# Patient Record
Sex: Female | Born: 1955 | Race: White | Hispanic: No | Marital: Married | Smoking: Never smoker
Health system: Southern US, Community
[De-identification: ages and names within clinical notes are randomized; demographics above are authoritative.]

## PROBLEM LIST (undated history)

## (undated) DIAGNOSIS — N3010 Interstitial cystitis (chronic) without hematuria: Secondary | ICD-10-CM

## (undated) DIAGNOSIS — F1011 Alcohol abuse, in remission: Secondary | ICD-10-CM

## (undated) DIAGNOSIS — E782 Mixed hyperlipidemia: Secondary | ICD-10-CM

## (undated) DIAGNOSIS — S14101A Unspecified injury at C1 level of cervical spinal cord, initial encounter: Secondary | ICD-10-CM

## (undated) DIAGNOSIS — N200 Calculus of kidney: Secondary | ICD-10-CM

## (undated) DIAGNOSIS — F32A Depression, unspecified: Secondary | ICD-10-CM

## (undated) DIAGNOSIS — F329 Major depressive disorder, single episode, unspecified: Secondary | ICD-10-CM

## (undated) HISTORY — DX: Alcohol abuse, in remission: F10.11

## (undated) HISTORY — DX: Interstitial cystitis (chronic) without hematuria: N30.10

## (undated) HISTORY — DX: Unspecified injury at C1 level of cervical spinal cord, initial encounter: S14.101A

## (undated) HISTORY — DX: Calculus of kidney: N20.0

## (undated) HISTORY — DX: Depression, unspecified: F32.A

## (undated) HISTORY — DX: Mixed hyperlipidemia: E78.2

---
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
## 2016-01-18 ENCOUNTER — Encounter: Payer: Self-pay | Admitting: Internal Medicine

## 2016-09-01 ENCOUNTER — Ambulatory Visit
Admission: RE | Admit: 2016-09-01 | Discharge: 2016-09-01 | Disposition: A | Discharge status: Home / Self Care | Payer: Commercial Managed Care - PPO | Source: Ambulatory Visit | Attending: Family Medicine | Admitting: Family Medicine

## 2016-09-01 ENCOUNTER — Other Ambulatory Visit: Payer: Self-pay | Admitting: Family Medicine

## 2016-09-01 DIAGNOSIS — R0602 Shortness of breath: Secondary | ICD-10-CM

## 2017-05-21 ENCOUNTER — Ambulatory Visit
Admission: RE | Admit: 2017-05-21 | Discharge: 2017-05-21 | Disposition: A | Discharge status: Home / Self Care | Payer: Commercial Managed Care - PPO | Source: Ambulatory Visit | Attending: Family Medicine | Admitting: Family Medicine

## 2017-05-21 ENCOUNTER — Other Ambulatory Visit: Payer: Self-pay | Admitting: Family Medicine

## 2017-05-21 DIAGNOSIS — R609 Edema, unspecified: Secondary | ICD-10-CM

## 2017-05-21 DIAGNOSIS — G8929 Other chronic pain: Secondary | ICD-10-CM

## 2017-07-04 ENCOUNTER — Emergency Department (HOSPITAL_COMMUNITY)
Admission: EM | Admit: 2017-07-04 | Discharge: 2017-07-04 | Disposition: A | Discharge status: Home / Self Care | Payer: Commercial Managed Care - PPO | Attending: Emergency Medicine | Admitting: Emergency Medicine

## 2017-07-04 ENCOUNTER — Other Ambulatory Visit: Payer: Self-pay

## 2017-07-04 ENCOUNTER — Emergency Department (HOSPITAL_COMMUNITY): Payer: Commercial Managed Care - PPO

## 2017-07-04 ENCOUNTER — Encounter (HOSPITAL_COMMUNITY): Payer: Self-pay

## 2017-07-04 DIAGNOSIS — Z79899 Other long term (current) drug therapy: Secondary | ICD-10-CM | POA: Insufficient documentation

## 2017-07-04 DIAGNOSIS — N200 Calculus of kidney: Secondary | ICD-10-CM | POA: Insufficient documentation

## 2017-07-04 DIAGNOSIS — Z91040 Latex allergy status: Secondary | ICD-10-CM | POA: Insufficient documentation

## 2017-07-04 DIAGNOSIS — R1032 Left lower quadrant pain: Secondary | ICD-10-CM | POA: Diagnosis present

## 2017-07-04 LAB — URINALYSIS, ROUTINE W REFLEX MICROSCOPIC
Bilirubin Urine: NEGATIVE
Glucose, UA: NEGATIVE mg/dL
Ketones, ur: 20 mg/dL — AB
Leukocytes, UA: NEGATIVE
Nitrite: POSITIVE — AB
Protein, ur: 30 mg/dL — AB
Specific Gravity, Urine: 1.018 (ref 1.005–1.030)
pH: 8 (ref 5.0–8.0)

## 2017-07-04 LAB — CBC
HCT: 37.1 % (ref 36.0–46.0)
Hemoglobin: 12.7 g/dL (ref 12.0–15.0)
MCH: 29.9 pg (ref 26.0–34.0)
MCHC: 34.2 g/dL (ref 30.0–36.0)
MCV: 87.3 fL (ref 78.0–100.0)
Platelets: 175 10*3/uL (ref 150–400)
RBC: 4.25 MIL/uL (ref 3.87–5.11)
RDW: 12.2 % (ref 11.5–15.5)
WBC: 6.7 10*3/uL (ref 4.0–10.5)

## 2017-07-04 LAB — COMPREHENSIVE METABOLIC PANEL
ALT: 14 U/L (ref 14–54)
AST: 16 U/L (ref 15–41)
Albumin: 4.4 g/dL (ref 3.5–5.0)
Alkaline Phosphatase: 63 U/L (ref 38–126)
Anion gap: 8 (ref 5–15)
BUN: 16 mg/dL (ref 6–20)
CO2: 23 mmol/L (ref 22–32)
Calcium: 9.5 mg/dL (ref 8.9–10.3)
Chloride: 103 mmol/L (ref 101–111)
Creatinine, Ser: 0.9 mg/dL (ref 0.44–1.00)
GFR calc Af Amer: >60 mL/min (ref >60)
GFR calc non Af Amer: >60 mL/min (ref >60)
Glucose, Bld: 119 mg/dL — ABNORMAL HIGH (ref 65–99)
Potassium: 3.7 mmol/L (ref 3.5–5.1)
Sodium: 134 mmol/L — ABNORMAL LOW (ref 135–145)
Total Bilirubin: 1.5 mg/dL — ABNORMAL HIGH (ref 0.3–1.2)
Total Protein: 6.6 g/dL (ref 6.5–8.1)

## 2017-07-04 LAB — LIPASE, BLOOD: Lipase: 47 U/L (ref 11–51)

## 2017-07-04 MED ORDER — ONDANSETRON 4 MG PO TBDP
4.0000 mg | ORAL_TABLET | Freq: Once | ORAL | Status: AC | PRN
  Administered 2017-07-04: 4 mg via ORAL
  Filled 2017-07-04: qty 1

## 2017-07-04 MED ORDER — OXYCODONE-ACETAMINOPHEN 5-325 MG PO TABS: 1.0000 | ORAL_TABLET | ORAL | 0 refills | Status: DC | PRN

## 2017-07-04 MED ORDER — DEXTROSE 5 % IV SOLN
1.0000 g | Freq: Once | INTRAVENOUS | Status: AC
  Administered 2017-07-04: 1 g via INTRAVENOUS
  Filled 2017-07-04: qty 10

## 2017-07-04 MED ORDER — SODIUM CHLORIDE 0.9 % IV BOLUS (SEPSIS)
1000.0000 mL | Freq: Once | INTRAVENOUS | Status: AC
  Administered 2017-07-04: 1000 mL via INTRAVENOUS

## 2017-07-04 MED ORDER — OXYCODONE-ACETAMINOPHEN 5-325 MG PO TABS
2.0000 | ORAL_TABLET | Freq: Once | ORAL | Status: AC
  Administered 2017-07-04: 2 via ORAL
  Filled 2017-07-04: qty 2

## 2017-07-04 MED ORDER — OXYCODONE-ACETAMINOPHEN 5-325 MG PO TABS
1.0000 | ORAL_TABLET | ORAL | Status: DC | PRN
  Administered 2017-07-04: 1 via ORAL
  Filled 2017-07-04: qty 1

## 2017-07-04 MED ORDER — HYDROMORPHONE HCL 1 MG/ML IJ SOLN
0.5000 mg | Freq: Once | INTRAMUSCULAR | Status: AC
  Administered 2017-07-04: 0.5 mg via INTRAVENOUS
  Filled 2017-07-04: qty 1

## 2017-07-04 MED ORDER — CEPHALEXIN 500 MG PO CAPS: 1000.0000 mg | ORAL_CAPSULE | Freq: Two times a day (BID) | ORAL | 0 refills | Status: DC

## 2017-07-04 MED ORDER — ONDANSETRON 4 MG PO TBDP: 4.0000 mg | ORAL_TABLET | ORAL | 0 refills | Status: DC | PRN

## 2017-07-04 MED ORDER — TAMSULOSIN HCL 0.4 MG PO CAPS
0.4000 mg | ORAL_CAPSULE | Freq: Once | ORAL | Status: AC
  Administered 2017-07-04: 0.4 mg via ORAL
  Filled 2017-07-04: qty 1

## 2017-07-04 MED ORDER — SODIUM CHLORIDE 0.9 % IV SOLN
1000.0000 mL | INTRAVENOUS | Status: DC
Start: 2017-07-04 — End: 2017-07-04
  Administered 2017-07-04: 1000 mL via INTRAVENOUS

## 2017-07-04 MED ORDER — ONDANSETRON HCL 4 MG/2ML IJ SOLN
4.0000 mg | Freq: Once | INTRAMUSCULAR | Status: AC
  Administered 2017-07-04: 4 mg via INTRAVENOUS
  Filled 2017-07-04: qty 2

## 2017-07-04 MED ORDER — TAMSULOSIN HCL 0.4 MG PO CAPS: 0.4000 mg | ORAL_CAPSULE | Freq: Every day | ORAL | 0 refills | Status: DC

## 2017-07-04 MED ORDER — KETOROLAC TROMETHAMINE 30 MG/ML IJ SOLN
30.0000 mg | Freq: Once | INTRAMUSCULAR | Status: AC
  Administered 2017-07-04: 30 mg via INTRAVENOUS
  Filled 2017-07-04: qty 1

## 2017-07-04 MED ORDER — IBUPROFEN 800 MG PO TABS: 800.0000 mg | ORAL_TABLET | Freq: Three times a day (TID) | ORAL | 0 refills | Status: DC

## 2017-07-04 NOTE — ED Provider Notes (Signed)
[facility]-EMERGENCY DEPT Provider Note   CSN: [id number] Arrival date & time: 07/04/17  1452     History   Chief Complaint Chief Complaint  Patient presents with  . Flank Pain    L  . Abdominal Pain    LLQ    HPI Sherry Ford is a 62 y.o. female.  HPI Patient has left flank pain.  It became severe this morning.  She had associated nausea and vomiting.  It felt similar to a prior kidney stone.  Patient reports that she had a episode about 2 weeks ago when she got pain and thought that maybe it was a stone but it improved.  Last time she had a kidney stone was about 6 years ago.  She reports today she perceives some chills no fever.  No pain burning urgency with urination. History reviewed. No pertinent past medical history.  There are no active problems to display for this patient.   The histories are not reviewed yet. Please review them in the "History" navigator section and refresh this SmartLink.  OB History    No data available       Home Medications    Prior to Admission medications   Medication Sig Start Date End Date Taking? Authorizing Provider  buPROPion (WELLBUTRIN XL) 300 MG 24 hr tablet Take 300 mg by mouth daily. 05/29/17  Yes [provider]  Calcium Carbonate-Vit D-Min (CALCIUM 1200) 1200-1000 MG-UNIT CHEW Chew 1 tablet by mouth daily.   Yes [provider]  cholecalciferol (VITAMIN D) 1000 units tablet Take 2,000 Units by mouth daily.   Yes [provider]  cyanocobalamin (,VITAMIN B-12,) 1000 MCG/ML injection Inject 1,000 mcg into the muscle every 14 (fourteen) days. 05/21/17  Yes [provider]  FLUoxetine (PROZAC) 20 MG capsule Take 20 mg by mouth daily. 06/19/17  Yes [provider]  ibuprofen (ADVIL,MOTRIN) 200 MG tablet Take 400 mg by mouth every 6 (six) hours as needed for moderate pain.   Yes [provider]  Phenazopyridine HCl (AZO URINARY PAIN) 97.5 MG TABS Take 195  mg by mouth at bedtime.   Yes [provider]  PROAIR RESPICLICK 108 (90 Base) MCG/ACT AEPB Inhale 2 puffs into the lungs every 4 (four) hours as needed for wheezing or shortness of breath. 05/21/17  Yes [provider]  ranitidine (ZANTAC) 150 MG capsule Take 150 mg by mouth at bedtime. 05/21/17  Yes [provider]  traZODone (DESYREL) 50 MG tablet Take 50 mg by mouth at bedtime. 06/30/17  Yes [provider]    Family History History reviewed. No pertinent family history.  Social History Social History   Tobacco Use  . Smoking status: Not on file  Substance Use Topics  . Alcohol use: Not on file  . Drug use: Not on file     Allergies   Dairy aid [lactase]; Latex; and Gluten meal   Review of Systems Review of Systems 10 Systems reviewed and are negative for acute change except as noted in the HPI.   Physical Exam Updated Vital Signs BP (!) 144/76 (BP Location: Left Arm)   Pulse 78   Temp 98.1 F (36.7 C) (Oral)   Resp 16   Ht 5\' 2"  (1.575 m)   Wt 54.4 kg (120 lb)   SpO2 98%   BMI 21.95 kg/m   Physical Exam  Constitutional: She is oriented to person, place, and time. She appears well-developed and well-nourished. No distress.  HENT:  Head: Normocephalic and atraumatic.  Eyes: Conjunctivae are normal.  Neck: Neck supple.  Cardiovascular: Normal rate and regular rhythm.  No murmur heard. Pulmonary/Chest: Effort normal and breath sounds normal. No respiratory distress.  Abdominal: Soft. There is no tenderness. There is no guarding.  Musculoskeletal: She exhibits no edema or tenderness.  Neurological: She is alert and oriented to person, place, and time. No cranial nerve deficit. She exhibits normal muscle tone. Coordination normal.  Skin: Skin is warm and dry.  Psychiatric: She has a normal mood and affect.  Nursing note and vitals reviewed.    ED Treatments / Results  Labs (all labs ordered are listed, but only abnormal  results are displayed) Labs Reviewed  COMPREHENSIVE METABOLIC PANEL - Abnormal; Notable for the following components:      Result Value   Sodium 134 (*)    Glucose, Bld 119 (*)    Total Bilirubin 1.5 (*)    All other components within normal limits  URINALYSIS, ROUTINE W REFLEX MICROSCOPIC - Abnormal; Notable for the following components:   Color, Urine AMBER (*)    APPearance CLOUDY (*)    Hgb urine dipstick LARGE (*)    Ketones, ur 20 (*)    Protein, ur 30 (*)    Nitrite POSITIVE (*)    Bacteria, UA MANY (*)    Squamous Epithelial / LPF 0-5 (*)    All other components within normal limits  LIPASE, BLOOD  CBC    EKG  EKG Interpretation None       Radiology Ct Renal Stone Study  Result Date: 07/04/2017 CLINICAL DATA:  62 year old female with history of left-sided flank pain. History of kidney stones. EXAM: CT ABDOMEN AND PELVIS WITHOUT CONTRAST TECHNIQUE: Multidetector CT imaging of the abdomen and pelvis was performed following the standard protocol without IV contrast. COMPARISON:  No priors. FINDINGS: Lower chest: Unremarkable. Hepatobiliary: No suspicious cystic or solid hepatic lesions are confidently identified on today's noncontrast CT examination. Unenhanced appearance of the gallbladder is normal. Pancreas: No definite pancreatic mass or peripancreatic inflammatory changes are noted on today's noncontrast CT examination. Spleen: Unremarkable. Adrenals/Urinary Tract: In the proximal third of the left ureter there is a 4 mm calculus (axial image 45 of series 2) which is associated with mild proximal left hydroureteronephrosis and perinephric stranding. Additional nonobstructive calculi are noted within the left renal collecting system measuring up to 5 mm in the lower pole. No additional calculi are noted elsewhere along the course of the left ureter, within the right renal collecting system, along the course of the right ureter or within the lumen of the urinary bladder.  Unenhanced appearance of the kidneys is otherwise unremarkable. Unenhanced appearance of the urinary bladder is normal. Bilateral adrenal glands are normal in appearance. Stomach/Bowel: Unenhanced appearance of the stomach is normal. There is no pathologic dilatation of small bowel or colon. Normal appendix. Vascular/Lymphatic: Aortic atherosclerosis, without evidence of aneurysm in the abdominal or pelvic vasculature. No lymphadenopathy noted in the abdomen or pelvis. Reproductive: Unenhanced appearance of the uterus and ovaries is unremarkable. Other: No significant volume of ascites.  No pneumoperitoneum. Musculoskeletal: There are no aggressive appearing lytic or blastic lesions noted in the visualized portions of the skeleton. IMPRESSION: 1. 4 mm calculus in the proximal third of the left ureter with mild proximal hydroureteronephrosis indicative of mild obstruction. 2. Multiple additional nonobstructive calculi within the left renal collecting system measuring up to 5 mm in the lower pole. 3. Aortic atherosclerosis. Aortic Atherosclerosis (ICD10-I70.0). Electronically Signed  By: Trudie Reed M.D.   On: 07/04/2017 17:11    Procedures Procedures (including critical care time)  Medications Ordered in ED Medications  oxyCODONE-acetaminophen (PERCOCET/ROXICET) 5-325 MG per tablet 1 tablet (1 tablet Oral Given 07/04/17 1527)  cefTRIAXone (ROCEPHIN) 1 g in dextrose 5 % 50 mL IVPB (not administered)  sodium chloride 0.9 % bolus 1,000 mL (not administered)    Followed by  0.9 %  sodium chloride infusion (not administered)  HYDROmorphone (DILAUDID) injection 0.5 mg (not administered)  ondansetron (ZOFRAN) injection 4 mg (not administered)  ketorolac (TORADOL) 30 MG/ML injection 30 mg (not administered)  tamsulosin (FLOMAX) capsule 0.4 mg (not administered)  ondansetron (ZOFRAN-ODT) disintegrating tablet 4 mg (4 mg Oral Given 07/04/17 1518)     Initial Impression / Assessment and Plan / ED Course   I have reviewed the triage vital signs and the nursing notes.  Pertinent labs & imaging results that were available during my care of the patient were reviewed by me and considered in my medical decision making (see chart for details).      Final Clinical Impressions(s) / ED Diagnoses   Final diagnoses:  Kidney stone   4 mm left-sided kidney stone identified.  Patient has had good pain control and is tolerating oral intake.  Urinalysis is somewhat suggestive of UTI with nitrite positive and bacteria but no significant leukocytes.  She has been given 1 dose of Rocephin and will be continued on Keflex until culture results.  She is counseled on necessity to return immediately should she develop fever, nausea vomiting or worsening general condition.  She will contact her urologist on Monday for follow-up. ED Discharge Orders    None       Arby Barrette, MD 07/04/17 1946

## 2017-07-04 NOTE — ED Notes (Signed)
Pt has walked to bathroom to attempt to give urine sample.

## 2017-07-04 NOTE — Discharge Instructions (Signed)
1.  Take ibuprofen every 8 hours for pain control.  Take Percocet 1-2 tablets as needed every 4 hours for additional pain control.  Use Zofran if needed for nausea.  Take 1 Flomax pill daily until your stone has passed. 2.  Strain your urine to try to capture the stone. 3.  Call your urologist Monday morning to schedule a follow-up appointment. 4.  Return to the emergency department if you have worsening pain, nausea vomiting, fever or other concerns. 5.  Your urine specimen tested mildly positive for infection, this is inconclusive on a urinalysis.  A urine culture has been added as well.  Take your antibiotics as prescribed until the results of your urine culture have been reviewed and it is determined whether or not you need further antibiotics.

## 2017-07-04 NOTE — ED Triage Notes (Signed)
Pt reports that she is experiencing LLQ pain and L side flank pain. She reports hx of kidney stones and reports that it feels similar. Endorsing nausea and vomiting. A&Ox4.

## 2017-07-06 LAB — URINE CULTURE: Culture: NO GROWTH

## 2017-08-04 ENCOUNTER — Other Ambulatory Visit: Payer: Self-pay | Admitting: Family Medicine

## 2017-08-04 ENCOUNTER — Ambulatory Visit
Admission: RE | Admit: 2017-08-04 | Discharge: 2017-08-04 | Disposition: A | Discharge status: Home / Self Care | Payer: Commercial Managed Care - PPO | Source: Ambulatory Visit | Attending: Family Medicine | Admitting: Family Medicine

## 2017-08-04 DIAGNOSIS — M545 Low back pain, unspecified: Secondary | ICD-10-CM

## 2017-08-04 DIAGNOSIS — M25561 Pain in right knee: Secondary | ICD-10-CM

## 2017-12-12 ENCOUNTER — Encounter (HOSPITAL_COMMUNITY): Payer: Self-pay | Admitting: Emergency Medicine

## 2017-12-12 ENCOUNTER — Emergency Department (HOSPITAL_COMMUNITY): Payer: 59

## 2017-12-12 ENCOUNTER — Emergency Department (HOSPITAL_COMMUNITY)
Admission: EM | Admit: 2017-12-12 | Discharge: 2017-12-12 | Disposition: A | Discharge status: Home / Self Care | Payer: 59 | Attending: Emergency Medicine | Admitting: Emergency Medicine

## 2017-12-12 ENCOUNTER — Other Ambulatory Visit: Payer: Self-pay

## 2017-12-12 DIAGNOSIS — Y998 Other external cause status: Secondary | ICD-10-CM | POA: Insufficient documentation

## 2017-12-12 DIAGNOSIS — Y929 Unspecified place or not applicable: Secondary | ICD-10-CM | POA: Diagnosis not present

## 2017-12-12 DIAGNOSIS — Z23 Encounter for immunization: Secondary | ICD-10-CM | POA: Insufficient documentation

## 2017-12-12 DIAGNOSIS — W260XXA Contact with knife, initial encounter: Secondary | ICD-10-CM | POA: Diagnosis not present

## 2017-12-12 DIAGNOSIS — Z79899 Other long term (current) drug therapy: Secondary | ICD-10-CM | POA: Insufficient documentation

## 2017-12-12 DIAGNOSIS — S61213A Laceration without foreign body of left middle finger without damage to nail, initial encounter: Secondary | ICD-10-CM

## 2017-12-12 DIAGNOSIS — Y93G1 Activity, food preparation and clean up: Secondary | ICD-10-CM | POA: Insufficient documentation

## 2017-12-12 DIAGNOSIS — Z91040 Latex allergy status: Secondary | ICD-10-CM | POA: Diagnosis not present

## 2017-12-12 MED ORDER — ACETAMINOPHEN 325 MG PO TABS
650.0000 mg | ORAL_TABLET | Freq: Once | ORAL | Status: AC
  Administered 2017-12-12: 650 mg via ORAL
  Filled 2017-12-12: qty 2

## 2017-12-12 MED ORDER — TETANUS-DIPHTH-ACELL PERTUSSIS 5-2.5-18.5 LF-MCG/0.5 IM SUSP
0.5000 mL | Freq: Once | INTRAMUSCULAR | Status: AC
  Administered 2017-12-12: 0.5 mL via INTRAMUSCULAR
  Filled 2017-12-12: qty 0.5

## 2017-12-12 MED ORDER — LIDOCAINE HCL (PF) 1 % IJ SOLN
5.0000 mL | Freq: Once | INTRAMUSCULAR | Status: AC
  Administered 2017-12-12: 5 mL
  Filled 2017-12-12: qty 5

## 2017-12-12 NOTE — ED Notes (Signed)
Patient Alert and oriented to baseline. Stable and ambulatory to baseline. Patient verbalized understanding of the discharge instructions.  Patient belongings were taken by the patient.   

## 2017-12-12 NOTE — ED Notes (Signed)
Patient transported to X-ray 

## 2017-12-12 NOTE — ED Triage Notes (Signed)
Pt presents to ED with laceration to middle finger, underlying structures visible.  Bleeding controlled.  Cut it with a knife while cutting avocado.

## 2017-12-12 NOTE — Discharge Instructions (Addendum)
You were seen in the emergency department for a laceration to your left third finger.  An x-ray did not show any fractures or dislocations.  Your tetanus was updated at today's visit.  4 stitches were placed in the laceration to close the wound. Do not get the area wet for the next 24 hours, after 24 hours you may get the area wet, but do not soak it. Take tylenol/motrin per over the counter dosing for pain. The stitches will need to be removed in 7 days. You may return to the Er, go to an urgent care, or see your primary care provider. Return to the ER sooner for any redness around the wound, pus type drainage, fever, or any other concerns.

## 2017-12-12 NOTE — ED Provider Notes (Signed)
[facility] Provider Note   CSN: [id number] Arrival date & time: 12/12/17  1735     History   Chief Complaint Chief Complaint  Patient presents with  . Laceration    HPI Sherry Ford is a 62 y.o. female who presents to the ED for laceration to L 3rd digit which occurred just PTA. Patient states she was cutting an avocado, knife slipped, and she cut the digit. Laceration is to the base between 2nd/3rd digits. She immediately held pressure, no cleaning/irrigation prior to arrival. Knife was not rusty or overly dirty, may have had some avocado on it. States area is painful, throbbing, 6/10 in severity, no alleviating/aggravating factors. Denies numbness or weakness. Unknown last tetanus. Patient is R hand dominant.    HPI  History reviewed. No pertinent past medical history.  There are no active problems to display for this patient.   History reviewed. No pertinent surgical history.   OB History   None      Home Medications    Prior to Admission medications   Medication Sig Start Date End Date Taking? Authorizing Provider  buPROPion (WELLBUTRIN XL) 300 MG 24 hr tablet Take 300 mg by mouth daily. 05/29/17   [provider]  Calcium Carbonate-Vit D-Min (CALCIUM 1200) 1200-1000 MG-UNIT CHEW Chew 1 tablet by mouth daily.    [provider]  cephALEXin (KEFLEX) 500 MG capsule Take 2 capsules (1,000 mg total) by mouth 2 (two) times daily. 07/04/17   Arby BarrettePfeiffer, Marcy, MD  cholecalciferol (VITAMIN D) 1000 units tablet Take 2,000 Units by mouth daily.    [provider]  cyanocobalamin (,VITAMIN B-12,) 1000 MCG/ML injection Inject 1,000 mcg into the muscle every 14 (fourteen) days. 05/21/17   [provider]  FLUoxetine (PROZAC) 20 MG capsule Take 20 mg by mouth daily. 06/19/17   [provider]  ibuprofen (ADVIL,MOTRIN) 200 MG tablet Take 400 mg by mouth every 6 (six) hours as needed for moderate  pain.    [provider]  ibuprofen (ADVIL,MOTRIN) 800 MG tablet Take 1 tablet (800 mg total) by mouth 3 (three) times daily. 07/04/17   Arby BarrettePfeiffer, Marcy, MD  ondansetron (ZOFRAN ODT) 4 MG disintegrating tablet Take 1 tablet (4 mg total) by mouth every 4 (four) hours as needed for nausea or vomiting. 07/04/17   Arby BarrettePfeiffer, Marcy, MD  oxyCODONE-acetaminophen (PERCOCET) 5-325 MG tablet Take 1-2 tablets by mouth every 4 (four) hours as needed. 07/04/17   Arby BarrettePfeiffer, Marcy, MD  Phenazopyridine HCl (AZO URINARY PAIN) 97.5 MG TABS Take 195 mg by mouth at bedtime.    [provider]  PROAIR RESPICLICK 108 (90 Base) MCG/ACT AEPB Inhale 2 puffs into the lungs every 4 (four) hours as needed for wheezing or shortness of breath. 05/21/17   [provider]  ranitidine (ZANTAC) 150 MG capsule Take 150 mg by mouth at bedtime. 05/21/17   [provider]  tamsulosin (FLOMAX) 0.4 MG CAPS capsule Take 1 capsule (0.4 mg total) by mouth daily. 07/04/17   Arby BarrettePfeiffer, Marcy, MD  traZODone (DESYREL) 50 MG tablet Take 50 mg by mouth at bedtime. 06/30/17   [provider]    Family History History reviewed. No pertinent family history.  Social History Social History   Tobacco Use  . Smoking status: Never Smoker  . Smokeless tobacco: Never Used  Substance Use Topics  . Alcohol use: Not Currently  . Drug use: Never     Allergies   Dairy aid [lactase]; Latex; and  Gluten meal   Review of Systems Review of Systems  Skin: Positive for wound.  Neurological: Negative for weakness and numbness.     Physical Exam Updated Vital Signs BP 136/80 (BP Location: Right Arm)   Pulse 82   Temp 98.1 F (36.7 C) (Oral)   Resp 18   SpO2 99%   Physical Exam  Constitutional: She appears well-developed and well-nourished. No distress.  HENT:  Head: Normocephalic and atraumatic.  Eyes: Conjunctivae are normal. Right eye exhibits no discharge. Left eye exhibits no discharge.    Cardiovascular:  Pulses:      Radial pulses are 2+ on the right side, and 2+ on the left side.  Musculoskeletal:  Upper extremities: Laceration per skin exam.  No obvious deformity, appreciable swelling, erythema, ecchymosis.  Patient has normal range of motion to bilateral wrists and all digits.  She is able to fully extend and flex at all MCP/PIP/DIP joints.  Able to cross second and third digits.  Nontender to palpation.  Neurological: She is alert.  Clear speech.  Sensation grossly intact bilateral upper extremities.  Patient has 5 out of 5 symmetric grip strength.  Skin: Capillary refill takes less than 2 seconds. Laceration (2 cm linear laceration to dorsoradial aspect of the L 3rd digit at the base, extends almost to webspace. No obvious foreign body, no appreciable tendon or nerve exposure. ) noted.  Psychiatric: She has a normal mood and affect. Her behavior is normal. Thought content normal.  Nursing note and vitals reviewed.    ED Treatments / Results  Labs (all labs ordered are listed, but only abnormal results are displayed) Labs Reviewed - No data to display  EKG None  Radiology Dg Hand Complete Left  Result Date: 12/12/2017 CLINICAL DATA:  Middle finger laceration. EXAM: LEFT HAND - COMPLETE 3+ VIEW COMPARISON:  None. FINDINGS: No acute fracture or dislocation. Joint spaces are preserved. Bone mineralization is normal. Small laceration noted at the base of the middle finger. No radiopaque foreign body identified. IMPRESSION: Laceration at the base of the middle finger. No acute osseous abnormality. Electronically Signed   By: Obie Dredge M.D.   On: 12/12/2017 18:58    Procedures .Marland KitchenLaceration Repair Date/Time: 12/12/2017 7:24 PM Performed by: Cherly Anderson, PA-C Authorized by: Cherly Anderson, PA-C   Consent:    Consent obtained:  Verbal   Consent given by:  Patient   Risks discussed:  Infection, need for additional repair, nerve damage, poor  cosmetic result, pain, poor wound healing, vascular damage, tendon damage and retained foreign body   Alternatives discussed:  No treatment Anesthesia (see MAR for exact dosages):    Anesthesia method:  Local infiltration   Local anesthetic:  Lidocaine 1% w/o epi Laceration details:    Location:  Finger   Finger location:  L long finger   Length (cm):  2 Repair type:    Repair type:  Simple Pre-procedure details:    Preparation:  Patient was prepped and draped in usual sterile fashion and imaging obtained to evaluate for foreign bodies Exploration:    Hemostasis achieved with:  Direct pressure   Wound exploration: wound explored through full range of motion and entire depth of wound probed and visualized     Contaminated: no   Treatment:    Area cleansed with:  Betadine   Amount of cleaning:  Standard   Irrigation solution:  Sterile water   Irrigation volume:  1L   Irrigation method:  Pressure wash Skin repair:  Repair method:  Sutures   Suture size:  4-0   Suture material:  Nylon   Suture technique:  Simple interrupted   Number of sutures:  4 Approximation:    Approximation:  Close Post-procedure details:    Dressing:  Antibiotic ointment and non-adherent dressing   Patient tolerance of procedure:  Tolerated well, no immediate complications   (including critical care time)  Medications Ordered in ED Medications  acetaminophen (TYLENOL) tablet 650 mg (650 mg Oral Given 12/12/17 1807)  lidocaine (PF) (XYLOCAINE) 1 % injection 5 mL (5 mLs Infiltration Given 12/12/17 1807)  Tdap (BOOSTRIX) injection 0.5 mL (0.5 mLs Intramuscular Given 12/12/17 1806)    Initial Impression / Assessment and Plan / ED Course  I have reviewed the triage vital signs and the nursing notes.  Pertinent labs & imaging results that were available during my care of the patient were reviewed by me and considered in my medical decision making (see chart for details).  Patient presents to the ED for L  3rd digit laceration, NVI distally, no evidence of tendon injury. Pressure irrigation performed. Wound explored and base of wound visualized in a bloodless field without evidence of foreign body, imaging confirmed no FBs, fractures, or dislocations.  Laceration repair per procedure not above, tolerated well. Tdap updated.  Patient is without comorbidities to effect normal wound healing.  Discussed suture home care with patient and answered questions. Patient to follow-up for wound check and suture removal in 7 days; they are to return to the ED sooner for signs of infection. I discussed results, treatment plan, need for follow-up, and return precautions with the patient. Provided opportunity for questions, patient confirmed understanding and is in agreement with plan.   Final Clinical Impressions(s) / ED Diagnoses   Final diagnoses:  Laceration of left middle finger without foreign body without damage to nail, initial encounter    ED Discharge Orders    None       Cherly Anderson, PA-C 12/12/17 1932    Loren Racer, MD 12/15/17 [phone]

## 2017-12-12 NOTE — ED Notes (Signed)
Patient is A&Ox4 at this time.  Patient in no signs of distress.  Please see providers note for complete history and physical exam.  

## 2018-08-12 ENCOUNTER — Encounter: Payer: Self-pay | Admitting: Internal Medicine

## 2018-08-12 LAB — ABI

## 2018-08-14 ENCOUNTER — Encounter: Payer: Self-pay | Admitting: Internal Medicine

## 2018-12-27 ENCOUNTER — Encounter: Payer: Self-pay | Admitting: Internal Medicine

## 2018-12-27 ENCOUNTER — Ambulatory Visit (INDEPENDENT_AMBULATORY_CARE_PROVIDER_SITE_OTHER): Payer: 59 | Admitting: Internal Medicine

## 2018-12-27 ENCOUNTER — Other Ambulatory Visit: Payer: Self-pay

## 2018-12-27 VITALS — BP 114/71 | HR 78 | Ht 62.0 in | Wt 125.4 lb

## 2018-12-27 DIAGNOSIS — Z789 Other specified health status: Secondary | ICD-10-CM

## 2018-12-27 DIAGNOSIS — E785 Hyperlipidemia, unspecified: Secondary | ICD-10-CM | POA: Diagnosis not present

## 2018-12-27 NOTE — Patient Instructions (Signed)
Medication Instructions:  Your physician recommends that you continue on your current medications as directed. Please refer to the Current Medication list given to you today.  If you need a refill on your cardiac medications before your next appointment, please call your pharmacy.   Lab work: FASTING lab work to check cholesterol If you have labs (blood work) drawn today and your tests are completely normal, you will receive your results only by: Marland Kitchen MyChart Message (if you have MyChart) OR . A paper copy in the mail If you have any lab test that is abnormal or we need to change your treatment, we will call you to review the results.  Testing/Procedures: Dr. Debara Pickett has ordered a CT coronary calcium score. This test is done at [address]. This is $150 out of pocket.  Coronary CalciumScan A coronary calcium scan is an imaging test used to look for deposits of calcium and other fatty materials (plaques) in the inner lining of the blood vessels of the heart (coronary arteries). These deposits of calcium and plaques can partly clog and narrow the coronary arteries without producing any symptoms or warning signs. This puts a person at risk for a heart attack. This test can detect these deposits before symptoms develop. Tell a health care provider about:  Any allergies you have.  All medicines you are taking, including vitamins, herbs, eye drops, creams, and over-the-counter medicines.  Any problems you or family members have had with anesthetic medicines.  Any blood disorders you have.  Any surgeries you have had.  Any medical conditions you have.  Whether you are pregnant or may be pregnant. What are the risks? Generally, this is a safe procedure. However, problems may occur, including:  Harm to a pregnant woman and her unborn baby. This test involves the use of radiation. Radiation exposure can be dangerous to a pregnant woman and her unborn baby. If you are pregnant,  you generally should not have this procedure done.  Slight increase in the risk of cancer. This is because of the radiation involved in the test. What happens before the procedure? No preparation is needed for this procedure. What happens during the procedure?  You will undress and remove any jewelry around your neck or chest.  You will put on a hospital gown.  Sticky electrodes will be placed on your chest. The electrodes will be connected to an electrocardiogram (ECG) machine to record a tracing of the electrical activity of your heart.  A CT scanner will take pictures of your heart. During this time, you will be asked to lie still and hold your breath for 2-3 seconds while a picture of your heart is being taken. The procedure may vary among health care providers and hospitals. What happens after the procedure?  You can get dressed.  You can return to your normal activities.  It is up to you to get the results of your test. Ask your health care provider, or the department that is doing the test, when your results will be ready. Summary  A coronary calcium scan is an imaging test used to look for deposits of calcium and other fatty materials (plaques) in the inner lining of the blood vessels of the heart (coronary arteries).  Generally, this is a safe procedure. Tell your health care provider if you are pregnant or may be pregnant.  No preparation is needed for this procedure.  A CT scanner will take pictures of your heart.  You can return  to your normal activities after the scan is done. This information is not intended to replace advice given to you by your health care provider. Make sure you discuss any questions you have with your health care provider. Document Released: 11/29/2007 Document Revised: 04/21/2016 Document Reviewed: 04/21/2016 Elsevier Interactive Patient Education  2017 [employer]    Follow-Up: Dr. Rennis GoldenHilty recommends that you schedule a follow up visit  with him the in the [facility] in 3-4 months.

## 2018-12-28 LAB — NMR, LIPOPROFILE
Cholesterol, Total: 247 mg/dL — ABNORMAL HIGH (ref 100–199)
HDL Particle Number: 30.9 umol/L (ref >=30.5)
HDL-C: 55 mg/dL (ref >39)
LDL Particle Number: 2327 nmol/L — ABNORMAL HIGH (ref <1000)
LDL Size: 20.7 nm (ref >20.5)
LDL-C: 175 mg/dL — ABNORMAL HIGH (ref 0–99)
LP-IR Score: 56 — ABNORMAL HIGH (ref <=45)
Small LDL Particle Number: 1130 nmol/L — ABNORMAL HIGH (ref <=527)
Triglycerides: 83 mg/dL (ref 0–149)

## 2018-12-29 ENCOUNTER — Other Ambulatory Visit: Payer: Self-pay

## 2018-12-29 ENCOUNTER — Ambulatory Visit (INDEPENDENT_AMBULATORY_CARE_PROVIDER_SITE_OTHER)
Admission: RE | Admit: 2018-12-29 | Discharge: 2018-12-29 | Disposition: A | Discharge status: Home / Self Care | Payer: Self-pay | Source: Ambulatory Visit | Attending: Internal Medicine | Admitting: Internal Medicine

## 2018-12-29 ENCOUNTER — Encounter: Payer: Self-pay | Admitting: Internal Medicine

## 2018-12-29 DIAGNOSIS — E785 Hyperlipidemia, unspecified: Secondary | ICD-10-CM

## 2018-12-29 NOTE — Progress Notes (Signed)
LIPID CLINIC CONSULT NOTE  Chief Complaint:  Manage dyslipidemia  Primary Care Physician: Mosetta PuttBlomgren, Peter, MD  Primary Cardiologist:  No primary care provider on file.  HPI:  Sherry Ford is a 63 y.o. female who is being seen today for the evaluation of dyslipidemia at the request of Mosetta PuttBlomgren, Peter, MD.  This is a pleasant 63 year old female patient kindly referred to the advanced lipid disorders clinic for management of dyslipidemia.  According to her PCP Dr. Duaine DredgeBlomgren, she has a longstanding diagnosis of mixed hyperlipidemia however has been intolerant to statins causing myalgias, fatigue and muscle weakness.  These seem to improve after discontinuing the medication and start up again when restarting the medicines.  Her last lipid NMR showed an elevated LDL particle number of 2517 with an LDL-C of 151.  She is previously been on atorvastatin, rosuvastatin, Livalo and ezetimibe.  She is currently not on any medications.  She was willing to consider PCSK9 inhibitor however was denied due to lack of cardiovascular disease.  Currently she reports being asymptomatic, denying any chest pain or worsening shortness of breath.  She does say that there is a family history of elevated cholesterol, although her father died at 7549 of colon cancer, her brother died at age 64 with non-Hodgkin's lymphoma secondary to HIV disease, so there is no significant early onset heart disease that I could find.  PMHx:  Past Medical History:  Diagnosis Date  . Depression   . History of alcohol abuse   . Interstitial cystitis   . Mixed hyperlipidemia   . Renal stone   . 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  FAMHx:  Family History  Problem Relation Age of Onset  . Breast cancer Mother   . Colon cancer Father   . HIV/AIDS Brother     SOCHx:   reports that she has never smoked. She has never used smokeless tobacco. She reports previous  alcohol use. She reports that she does not use drugs.  ALLERGIES:  Allergies  Allergen Reactions  . Dairy Aid [Lactase] Anaphylaxis  . Latex Shortness Of Breath    Throat itching.    . Gluten Meal Nausea And Vomiting    ROS: Pertinent items noted in HPI and remainder of comprehensive ROS otherwise negative.  HOME MEDS: Current Outpatient Medications on File Prior to Visit  Medication Sig Dispense Refill  . buPROPion (WELLBUTRIN XL) 300 MG 24 hr tablet Take 1 tablet by mouth daily.    . cholecalciferol (VITAMIN D) 1000 units tablet Take 2,000 Units by mouth daily.    . cyanocobalamin (,VITAMIN B-12,) 1000 MCG/ML injection Inject 1,000 mcg into the muscle every 14 (fourteen) days.  3  . FLUoxetine (PROZAC) 20 MG capsule Take 20 mg by mouth daily.  0  . hydrOXYzine (ATARAX/VISTARIL) 10 MG tablet Take 1 tablet by mouth daily.    Marland Kitchen. ibuprofen (ADVIL,MOTRIN) 200 MG tablet Take 400 mg by mouth every 6 (six) hours as needed for moderate pain.    Marland Kitchen. PROAIR RESPICLICK 108 (90 Base) MCG/ACT AEPB Inhale 2 puffs into the lungs every 4 (four) hours as needed for wheezing or shortness of breath.  1  . traZODone (DESYREL) 50 MG tablet Take 50 mg by mouth at bedtime.  11   No current facility-administered medications on file prior to visit.     LABS/IMAGING: Results for orders placed or performed in visit on 12/27/18 (from the  past 48 hour(s))  NMR, lipoprofile     Status: Abnormal   Collection Time: 12/27/18 12:27 PM  Result Value Ref Range   LDL Particle Number [id number] (H) <1,000 nmol/L    Comment:                           Low                   < 1000                           Moderate         1000 - 1299                           Borderline-High  1300 - 1599                           High             1600 - 2000                           Very High             > 2000    LDL-C 175 (H) 0 - 99 mg/dL    Comment:                           Optimal               <  100                            Above optimal     100 -  129                           Borderline        130 -  159                           High              160 -  189                           Very high             >  189 LDL-C is inaccurate if patient is non-fasting.    HDL-C 55 >39 mg/dL   Triglycerides 83 0 - 149 mg/dL   Cholesterol, Total 161247 (H) 100 - 199 mg/dL   HDL Particle Number [id number] >=30.5 umol/L   Small LDL Particle Number [id number] (H) <=527 nmol/L   LDL Size 20.7 >20.5 nm    Comment:  ----------------------------------------------------------                  ** INTERPRETATIVE INFORMATION**                  PARTICLE CONCENTRATION AND SIZE                     <--Lower CVD Risk   Higher CVD Risk-->   LDL AND HDL PARTICLES   Percentile in Reference  Population   HDL-P (total)        High     75th    50th    25th   Low                        >34.9    34.9    30.5    26.7   <26.7   Small LDL-P          Low      25th    50th    75th   High                        <117     117     527     839    >839   LDL Size   <-Large (Pattern A)->    <-Small (Pattern B)->                     23.0    20.6           20.5      19.0  ---------------------------------------------------------- Small LDL-P and LDL Size are associated with CVD risk, but not after LDL-P is taken into account.    LP-IR Score 56 (H) <=45    Comment: INSULIN RESISTANCE MARKER     <--Insulin Sensitive    Insulin Resistant-->            Percentile in Reference Population Insulin Resistance Score LP-IR Score   Low   25th   50th   75th   High               <27   27     45     63     >63 LP-IR Score is inaccurate if patient is non-fasting. The LP-IR score is a laboratory developed index that has been associated with insulin resistance and diabetes risk and should be used as one component of a physician's clinical assessment.    No results found.  LIPID PANEL: No results found for: CHOL, TRIG, HDL, CHOLHDL, VLDL, LDLCALC, LDLDIRECT  WEIGHTS: Wt  Readings from Last 3 Encounters:  12/27/18 125 lb 6.4 oz (56.9 kg)  07/04/17 120 lb (54.4 kg)    VITALS: BP 114/71   Pulse 78   Ht 5\' 2"  (1.575 m)   Wt 125 lb 6.4 oz (56.9 kg)   SpO2 98%   BMI 22.94 kg/m   EXAM: General appearance: alert and no distress Neck: no carotid bruit, no JVD and thyroid not enlarged, symmetric, no tenderness/mass/nodules Lungs: clear to auscultation bilaterally Heart: regular rate and rhythm Abdomen: soft, non-tender; bowel sounds normal; no masses,  no organomegaly Extremities: extremities normal, atraumatic, no cyanosis or edema Pulses: 2+ and symmetric Skin: Skin color, texture, turgor normal. No rashes or lesions Neurologic: Grossly normal Psych: Pleasant  EKG: N/A  ASSESSMENT: 1. Mixed dyslipidemia 2. Statin intolerant 3. Intermediate 10-year risk by pooled cohort equation (7.5%)  PLAN: 1.   Sherry Ford has a mixed dyslipidemia and has been statin intolerant.  She is likely at intermediate risk for coronary disease given age, however has few traditional cardiac risk factors and no evidence of early onset heart disease.  I would recommend a coronary artery calcium score to see if we can further risk stratify her and demonstrate any evidence of cardiovascular disease that may strengthen her case for PCSK9 inhibitor.  I do think that  is a good option for her.  If she cannot get approved for PCSK9 inhibitor or afford it, another option may be Nexletol (bempedoic acid), which is an agent that works similar to statins to metabolic pathways above HMG Co. a reductase in the liver and is associated with few side effects.  We will discuss treatment options at follow-up.  Thanks again for the kind referral.  Pixie Casino, MD, FACC, [place] Director of the Advanced Lipid Disorders &  Cardiovascular Risk Reduction Clinic Diplomate of the American Board of Clinical Lipidology Attending Cardiologist  Direct Dial:  [phone]  Fax: [phone]  Website:  [email or web address] 12/29/2018, 10:47 AM oco

## 2018-12-31 ENCOUNTER — Telehealth: Payer: Self-pay | Admitting: Internal Medicine

## 2018-12-31 NOTE — Telephone Encounter (Signed)
PA for [employer] submitted via [email or web address]  (Key: [id number])

## 2019-01-03 MED ORDER — NEXLETOL 180 MG PO TABS: 1.0000 | ORAL_TABLET | Freq: Every day | ORAL | 3 refills | Status: DC

## 2019-01-03 NOTE — Addendum Note (Signed)
Addended by: Fidel Levy on: 01/03/2019 01:59 PM   Modules accepted: Orders

## 2019-01-03 NOTE — Telephone Encounter (Signed)
Lets try Nexletol 180 mg daily.  Dr Lemmie Evens

## 2019-01-03 NOTE — Telephone Encounter (Signed)
PA has been denied.  [employer] denial letter printed for MD to review and advise

## 2019-01-03 NOTE — Telephone Encounter (Signed)
Attempted to call patient concerning medication. VM box is full. MyChart message sent

## 2019-01-04 ENCOUNTER — Telehealth: Payer: Self-pay | Admitting: Internal Medicine

## 2019-01-04 NOTE — Telephone Encounter (Signed)
PA for Nexletol submitted via [email or web address]  KeyHerbie Drape - PA Case ID: [id number] - Rx #: [id number]   Patient has tried/failed: simvastatin, lovastatin, pitavastatin, rosuvastatin, fluvastatin, atorvastatin, pravastatin

## 2019-01-04 NOTE — Telephone Encounter (Signed)
Patient OK with change in med per MD

## 2019-01-06 NOTE — Telephone Encounter (Signed)
Patient notified of approval via [place] has been sent to [facility]

## 2019-01-06 NOTE — Telephone Encounter (Signed)
Approved on July 22  Request Reference Number: [id number]. NEXLETOL TAB 180MG  is approved through 07/07/2019

## 2019-04-04 ENCOUNTER — Other Ambulatory Visit: Payer: Self-pay

## 2019-04-04 ENCOUNTER — Ambulatory Visit: Payer: 59 | Admitting: Internal Medicine

## 2019-04-04 ENCOUNTER — Encounter: Payer: Self-pay | Admitting: Internal Medicine

## 2019-04-04 VITALS — BP 104/67 | HR 72 | Temp 97.2°F | Ht 62.0 in | Wt 126.0 lb

## 2019-04-04 DIAGNOSIS — Z789 Other specified health status: Secondary | ICD-10-CM

## 2019-04-04 DIAGNOSIS — R931 Abnormal findings on diagnostic imaging of heart and coronary circulation: Secondary | ICD-10-CM | POA: Diagnosis not present

## 2019-04-04 DIAGNOSIS — E785 Hyperlipidemia, unspecified: Secondary | ICD-10-CM | POA: Diagnosis not present

## 2019-04-04 NOTE — Progress Notes (Signed)
LIPID CLINIC CONSULT NOTE  Chief Complaint:  Follow- up dyslipidemia  Primary Care Physician: Mosetta Putt, MD  Primary Cardiologist:  No primary care provider on file.  HPI:  Sherry Ford is a 63 y.o. female who is being seen today for the evaluation of dyslipidemia at the request of Mosetta Putt, MD.  This is a pleasant 63 year old female patient kindly referred to the advanced lipid disorders clinic for management of dyslipidemia.  According to her PCP Dr. Duaine Dredge, she has a longstanding diagnosis of mixed hyperlipidemia however has been intolerant to statins causing myalgias, fatigue and muscle weakness.  These seem to improve after discontinuing the medication and start up again when restarting the medicines.  Her last lipid NMR showed an elevated LDL particle number of 2517 with an LDL-C of 151.  She is previously been on atorvastatin, rosuvastatin, Livalo and ezetimibe.  She is currently not on any medications.  She was willing to consider PCSK9 inhibitor however was denied due to lack of cardiovascular disease.  Currently she reports being asymptomatic, denying any chest pain or worsening shortness of breath.  She does say that there is a family history of elevated cholesterol, although her father died at 77 of colon cancer, her brother died at age 36 with non-Hodgkin's lymphoma secondary to HIV disease, so there is no significant early onset heart disease that I could find.  04/04/2019  Sherry Ford returns today for follow-up of dyslipidemia.  She underwent coronary artery calcium scoring which was mildly elevated showing a score of 21 which was 64th percentile for age and sex matched control.  Based on some elevated coronary calcium and elevated LDL-C of 151, I recommended PCSK9 inhibitor however this was denied by her insurance company.  Subsequently were were able to get approval for Nexletol 180 mg daily.  She said she took this for several weeks to a month and  noticed initially some dizziness with the medication but that improved somewhat and then ultimately developed some nausea.  She said this was thought to be medication side effect of the Nexletol discontinue the medicine and it did improve.  I thought it was unusual that she thought she had 2 separate side effects related to the one medication and perhaps it was not a medication side effect at all.  She has been off the medicine for more than a month therefore it is not likely to be of benefit to recheck her cholesterol.  Previously she had taken ezetimibe she said which she tolerated but was not currently taking.  PMHx:  Past Medical History:  Diagnosis Date  . Depression   . History of alcohol abuse   . Interstitial cystitis   . Mixed hyperlipidemia   . Renal stone   . 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  FAMHx:  Family History  Problem Relation Age of Onset  . Breast cancer Mother   . Colon cancer Father   . HIV/AIDS Brother     SOCHx:   reports that she has never smoked. She has never used smokeless tobacco. She reports previous alcohol use. She reports that she does not use drugs.  ALLERGIES:  Allergies  Allergen Reactions  . Dairy Aid [Lactase] Anaphylaxis  . Latex Shortness Of Breath    Throat itching.    . Gluten Meal Nausea And Vomiting    ROS: Pertinent items noted in HPI and remainder of comprehensive ROS  otherwise negative.  HOME MEDS: Current Outpatient Medications on File Prior to Visit  Medication Sig Dispense Refill  . Bempedoic Acid (NEXLETOL) 180 MG TABS Take 1 tablet by mouth daily. (Patient taking differently: Take 1 tablet by mouth daily. Patient not taking currently.) 90 tablet 3  . buPROPion (WELLBUTRIN XL) 300 MG 24 hr tablet Take 1 tablet by mouth daily.    . cholecalciferol (VITAMIN D) 1000 units tablet Take 2,000 Units by mouth daily.    . cyanocobalamin (,VITAMIN B-12,) 1000 MCG/ML  injection Inject 1,000 mcg into the muscle every 14 (fourteen) days.  3  . FLUoxetine (PROZAC) 20 MG capsule Take 20 mg by mouth daily.  0  . hydrOXYzine (ATARAX/VISTARIL) 10 MG tablet Take 1 tablet by mouth daily.    Marland Kitchen ibuprofen (ADVIL,MOTRIN) 200 MG tablet Take 400 mg by mouth every 6 (six) hours as needed for moderate pain.    Marland Kitchen PROAIR RESPICLICK 993 (90 Base) MCG/ACT AEPB Inhale 2 puffs into the lungs every 4 (four) hours as needed for wheezing or shortness of breath.  1  . traZODone (DESYREL) 50 MG tablet Take 50 mg by mouth at bedtime.  11   No current facility-administered medications on file prior to visit.     LABS/IMAGING: No results found for this or any previous visit (from the past 48 hour(s)). No results found.  LIPID PANEL: No results found for: CHOL, TRIG, HDL, CHOLHDL, VLDL, LDLCALC, LDLDIRECT  WEIGHTS: Wt Readings from Last 3 Encounters:  04/04/19 126 lb (57.2 kg)  12/27/18 125 lb 6.4 oz (56.9 kg)  07/04/17 120 lb (54.4 kg)    VITALS: BP 104/67   Pulse 72   Temp (!) 97.2 F (36.2 C)   Ht 5\' 2"  (1.575 m)   Wt 126 lb (57.2 kg)   SpO2 97%   BMI 23.05 kg/m   EXAM: Deferred  EKG: N/A  ASSESSMENT: 1. Mixed dyslipidemia 2. Statin intolerant 3. Intermediate 10-year risk by pooled cohort equation (7.5%)  PLAN: 1.   Ms. Clayburn Ford has a mixed dyslipidemia and given her lower risk but abnormal coronary calcium score I think is reasonable to least consider targeting her LDL less than 100.  She has been statin intolerant and may be able to take ezetimibe.  I like for her to try the Nexletol as we may need both agents in order to get to target.  Plan to restart the Nexletol to see if it is better tolerated and then continue it.  We will then plan to recheck a lipid in 3 months and then may consider adding ezetimibe back if necessary.  If she does not tolerate it then I would go back to ezetimibe to see if we are able to achieve some improvement in her numbers with  that.  Follow-up 3 to 4 months.  Sherry Casino, MD, [facility], [place] Director of the Advanced Lipid Disorders &  Cardiovascular Risk Reduction Clinic Diplomate of the American Board of Clinical Lipidology Attending Cardiologist  Direct Dial: [phone]  Fax: [phone]  Website:  [email or web address] 04/04/2019, 11:18 AM oco

## 2019-04-04 NOTE — Patient Instructions (Signed)
Medication Instructions:  Dr. Debara Pickett suggests that you re-try Nexletol   Please notify our office about your symptoms *If you need a refill on your cardiac medications before your next appointment, please call your pharmacy*  Lab Work: None ordered at this time  Follow-Up: Dr. Debara Pickett recommends that you schedule a follow up visit with him the in the [place] in 3-4 months.

## 2019-04-18 ENCOUNTER — Encounter: Payer: Self-pay | Admitting: Internal Medicine

## 2019-04-18 MED ORDER — EZETIMIBE 10 MG PO TABS: 10.0000 mg | ORAL_TABLET | Freq: Every day | ORAL | 3 refills | Status: DC

## 2019-08-12 ENCOUNTER — Ambulatory Visit: Payer: 59

## 2019-08-14 ENCOUNTER — Ambulatory Visit: Payer: Self-pay | Attending: Internal Medicine

## 2019-08-14 DIAGNOSIS — Z23 Encounter for immunization: Secondary | ICD-10-CM | POA: Insufficient documentation

## 2019-08-14 NOTE — Progress Notes (Signed)
   Covid-19 Vaccination Clinic  Name:  Sherry Ford    MRN: [id number] DOB: Aug 22, 1955  08/14/2019  [name] was observed post Covid-19 immunization for 15 minutes without incidence. She was provided with Vaccine Information Sheet and instruction to access the V-Safe system.   [name] was instructed to call 911 with any severe reactions post vaccine: Marland Kitchen Difficulty breathing  . Swelling of your face and throat  . A fast heartbeat  . A bad rash all over your body  . Dizziness and weakness    Immunizations Administered    Name Date Dose VIS Date Route   Pfizer COVID-19 Vaccine 08/14/2019  3:10 PM 0.3 mL 05/27/2019 Intramuscular   Manufacturer: [employer], [employer]   Lot: [id number]   NDC: [address]-5

## 2019-08-24 ENCOUNTER — Telehealth: Payer: Self-pay | Admitting: Internal Medicine

## 2019-08-24 DIAGNOSIS — Z789 Other specified health status: Secondary | ICD-10-CM

## 2019-08-24 DIAGNOSIS — E785 Hyperlipidemia, unspecified: Secondary | ICD-10-CM

## 2019-08-24 NOTE — Telephone Encounter (Signed)
Please read the chart - specifically Jenna's note from 06/14/19, instructing her to come for lipid labs (ordered) in March before her appt.  Dr Rennis Golden

## 2019-08-24 NOTE — Telephone Encounter (Signed)
Pt set up a f/u appt to see Dr. Rennis Golden in the lipid clinic for 09-20-19. She wanted to know if she needed to have another set of lipid panels done prior to her appointment.   The patient does use MyChart, so the office can contact her that way if necessary

## 2019-08-25 NOTE — Telephone Encounter (Signed)
MyChart message sent to patient.

## 2019-09-14 ENCOUNTER — Ambulatory Visit: Payer: Self-pay | Attending: Internal Medicine

## 2019-09-14 DIAGNOSIS — Z23 Encounter for immunization: Secondary | ICD-10-CM

## 2019-09-14 NOTE — Progress Notes (Signed)
   Covid-19 Vaccination Clinic  Name:  Sherry Ford    MRN: [id number] DOB: 1955/07/10  09/14/2019  Sherry Ford was observed post Covid-19 immunization for 15 minutes without incident. She was provided with Vaccine Information Sheet and instruction to access the V-Safe system.   Sherry Ford was instructed to call 911 with any severe reactions post vaccine: Marland Kitchen Difficulty breathing  . Swelling of face and throat  . A fast heartbeat  . A bad rash all over body  . Dizziness and weakness   Immunizations Administered    Name Date Dose VIS Date Route   Pfizer COVID-19 Vaccine 09/14/2019  1:09 PM 0.3 mL 05/27/2019 Intramuscular   Manufacturer: [employer], [employer]   Lot: [id number]   NDC: [address]-4

## 2019-09-16 LAB — NMR, LIPOPROFILE
Cholesterol, Total: 190 mg/dL (ref 100–199)
HDL Particle Number: 36.6 umol/L (ref >=30.5)
HDL-C: 53 mg/dL (ref >39)
LDL Particle Number: 1483 nmol/L — ABNORMAL HIGH (ref <1000)
LDL Size: 20.8 nm (ref >20.5)
LDL-C (NIH Calc): 111 mg/dL — ABNORMAL HIGH (ref 0–99)
LP-IR Score: 34 (ref <=45)
Small LDL Particle Number: 777 nmol/L — ABNORMAL HIGH (ref <=527)
Triglycerides: 145 mg/dL (ref 0–149)

## 2019-09-20 ENCOUNTER — Encounter: Payer: Self-pay | Admitting: Internal Medicine

## 2019-09-20 ENCOUNTER — Other Ambulatory Visit: Payer: Self-pay

## 2019-09-20 ENCOUNTER — Ambulatory Visit (INDEPENDENT_AMBULATORY_CARE_PROVIDER_SITE_OTHER): Payer: Medicare Other | Admitting: Internal Medicine

## 2019-09-20 ENCOUNTER — Telehealth: Payer: Self-pay | Admitting: Internal Medicine

## 2019-09-20 VITALS — BP 118/72 | HR 72 | Temp 97.0°F | Ht 62.0 in | Wt 125.8 lb

## 2019-09-20 DIAGNOSIS — E785 Hyperlipidemia, unspecified: Secondary | ICD-10-CM

## 2019-09-20 DIAGNOSIS — R931 Abnormal findings on diagnostic imaging of heart and coronary circulation: Secondary | ICD-10-CM

## 2019-09-20 DIAGNOSIS — M791 Myalgia, unspecified site: Secondary | ICD-10-CM | POA: Diagnosis not present

## 2019-09-20 DIAGNOSIS — Z789 Other specified health status: Secondary | ICD-10-CM

## 2019-09-20 DIAGNOSIS — T466X5A Adverse effect of antihyperlipidemic and antiarteriosclerotic drugs, initial encounter: Secondary | ICD-10-CM

## 2019-09-20 MED ORDER — PRALUENT 75 MG/ML ~~LOC~~ SOAJ: 1.0000 | SUBCUTANEOUS | 11 refills | Status: DC

## 2019-09-20 NOTE — Patient Instructions (Signed)
Medication Instructions:  Continue current medications  *If you need a refill on your cardiac medications before your next appointment, please call your pharmacy*   Lab Work: None Ordered  Testing/Procedures: None Ordered   Follow-Up: At [facility], you and your health needs are our priority.  As part of our continuing mission to provide you with exceptional heart care, we have created designated Provider Care Teams.  These Care Teams include your primary Cardiologist (physician) and Advanced Practice Providers (APPs -  Physician Assistants and Nurse Practitioners) who all work together to provide you with the care you need, when you need it.  We recommend signing up for the patient portal called "MyChart".  Sign up information is provided on this After Visit Summary.  MyChart is used to connect with patients for Virtual Visits (Telemedicine).  Patients are able to view lab/test results, encounter notes, upcoming appointments, etc.  Non-urgent messages can be sent to your provider as well.   To learn more about what you can do with MyChart, go to [email or web address].    Your next appointment:   4 month(s)  The format for your next appointment:   In Person  Provider:   You may see Zoila Shutter, MD or one of the following Advanced Practice Providers on your designated Care Team:    Azalee Course, PA-C  Micah Flesher, PA-C or   Judy Pimple, [place]

## 2019-09-20 NOTE — Telephone Encounter (Signed)
Attempted PA for Praluent 75mg /mL q2 weeks on [facility] - authorization on file for request ID: [facility] BIN: [id number] PCN: [id number] RxGrp: [id number]  Rx sent to pharmacy Patient notified via MyChart

## 2019-09-20 NOTE — Addendum Note (Signed)
Addended by: Lindell Spar on: 09/20/2019 10:31 AM   Modules accepted: Orders

## 2019-09-20 NOTE — Progress Notes (Signed)
LIPID CLINIC CONSULT NOTE  Chief Complaint:  Follow- up dyslipidemia  Primary Care Physician: Mosetta Putt, MD  Primary Cardiologist:  No primary care provider on file.  HPI:  Sherry Ford is a 64 y.o. female who is being seen today for the evaluation of dyslipidemia at the request of Mosetta Putt, MD.  This is a pleasant 64 year old female patient kindly referred to the advanced lipid disorders clinic for management of dyslipidemia.  According to her PCP Dr. Duaine Dredge, she has a longstanding diagnosis of mixed hyperlipidemia however has been intolerant to statins causing myalgias, fatigue and muscle weakness.  These seem to improve after discontinuing the medication and start up again when restarting the medicines.  Her last lipid NMR showed an elevated LDL particle number of 2517 with an LDL-C of 151.  She is previously been on atorvastatin, rosuvastatin, Livalo and ezetimibe.  She is currently not on any medications.  She was willing to consider PCSK9 inhibitor however was denied due to lack of cardiovascular disease.  Currently she reports being asymptomatic, denying any chest pain or worsening shortness of breath.  She does say that there is a family history of elevated cholesterol, although her father died at 20 of colon cancer, her brother died at age 42 with non-Hodgkin's lymphoma secondary to HIV disease, so there is no significant early onset heart disease that I could find.  04/04/2019  Sherry Ford returns today for follow-up of dyslipidemia.  She underwent coronary artery calcium scoring which was mildly elevated showing a score of 21 which was 64th percentile for age and sex matched control.  Based on some elevated coronary calcium and elevated LDL-C of 151, I recommended PCSK9 inhibitor however this was denied by her insurance company.  Subsequently were were able to get approval for Nexletol 180 mg daily.  She said she took this for several weeks to a month and  noticed initially some dizziness with the medication but that improved somewhat and then ultimately developed some nausea.  She said this was thought to be medication side effect of the Nexletol discontinue the medicine and it did improve.  I thought it was unusual that she thought she had 2 separate side effects related to the one medication and perhaps it was not a medication side effect at all.  She has been off the medicine for more than a month therefore it is not likely to be of benefit to recheck her cholesterol.  Previously she had taken ezetimibe she said which she tolerated but was not currently taking.  09/20/2019  Sherry Ford is seen today in follow-up.  Unfortunately she could not tolerate Nexletol.  She has been on ezetimibe.  This has improved her lipids in addition to dietary changes.  Her most recent lipid NMR showed a decrease in particle number from [phone].  LDL-C is 111.  Particle numbers are also improved from 1130-777 for small LDL-P.  All this is some improvement, she remains above target LDL less than 100.  Unfortunately she was statin intolerant as well and her options for further therapy are quite limited.  She previously had been denied PCSK9 inhibitor however now that she is on maximal therapy without other options I think this is the only additional option that we could provide her.  PMHx:  Past Medical History:  Diagnosis Date  . Depression   . History of alcohol abuse   . Interstitial cystitis   . Mixed hyperlipidemia   . Renal stone   .  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  FAMHx:  Family History  Problem Relation Age of Onset  . Breast cancer Mother   . Colon cancer Father   . HIV/AIDS Brother     SOCHx:   reports that she has never smoked. She has never used smokeless tobacco. She reports previous alcohol use. She reports that she does not use drugs.  ALLERGIES:  Allergies  Allergen  Reactions  . Dairy Aid [Lactase] Anaphylaxis  . Latex Shortness Of Breath    Throat itching.    . Bempedoic Acid Nausea Only  . Gluten Meal Nausea And Vomiting    ROS: Pertinent items noted in HPI and remainder of comprehensive ROS otherwise negative.  HOME MEDS: Current Outpatient Medications on File Prior to Visit  Medication Sig Dispense Refill  . buPROPion (WELLBUTRIN XL) 300 MG 24 hr tablet Take 1 tablet by mouth daily.    . cholecalciferol (VITAMIN D) 1000 units tablet Take 2,000 Units by mouth daily.    . cyanocobalamin (,VITAMIN B-12,) 1000 MCG/ML injection Inject 1,000 mcg into the muscle every 14 (fourteen) days.  3  . FLUoxetine (PROZAC) 20 MG tablet     . hydrOXYzine (ATARAX/VISTARIL) 10 MG tablet TAKE 1 TABLET(10 MG) BY MOUTH EVERY NIGHT    . ibuprofen (ADVIL,MOTRIN) 200 MG tablet Take 400 mg by mouth every 6 (six) hours as needed for moderate pain.    Marland Kitchen PROAIR RESPICLICK 108 (90 Base) MCG/ACT AEPB Inhale 2 puffs into the lungs every 4 (four) hours as needed for wheezing or shortness of breath.  1  . ezetimibe (ZETIA) 10 MG tablet Take 1 tablet (10 mg total) by mouth daily. 90 tablet 3   No current facility-administered medications on file prior to visit.    LABS/IMAGING: No results found for this or any previous visit (from the past 48 hour(s)). No results found.  LIPID PANEL: No results found for: CHOL, TRIG, HDL, CHOLHDL, VLDL, LDLCALC, LDLDIRECT  WEIGHTS: Wt Readings from Last 3 Encounters:  09/20/19 125 lb 12.8 oz (57.1 kg)  04/04/19 126 lb (57.2 kg)  12/27/18 125 lb 6.4 oz (56.9 kg)    VITALS: BP 118/72   Pulse 72   Temp (!) 97 F (36.1 C)   Ht 5\' 2"  (1.575 m)   Wt 125 lb 12.8 oz (57.1 kg)   SpO2 97%   BMI 23.01 kg/m   EXAM: Deferred  EKG: N/A  ASSESSMENT: 1. Mixed dyslipidemia 2. Statin intolerant 3. Nexletol intolerant 4. Intermediate 10-year risk by pooled cohort equation (7.5%)  PLAN: 1.   Ms. has not been able to  tolerate statins or Nexletol.  She is currently on ezetimibe but remains well above target LDLs.  Again I think she would be a good candidate for PCSK9 inhibitor.  She recently transition to Medicare.  I like to reapply for PCSK9 inhibitor based on these intolerances and her inability to reach a target LDL.  She would likely be on Praluent 75 mg every 2 weeks.  Once we get prior authorization, we will send that to her pharmacy plan to repeat lipids in about 3 to 4 months. Follow-up with me at that time.  Yong Channel, MD, [facility], FACP  [place]  [facility]  Medical Director of the Advanced Lipid Disorders &  Cardiovascular Risk Reduction Clinic Diplomate of the American Board of Clinical Lipidology Attending Cardiologist  Direct Dial: [phone]  Fax: [phone]  Website:  [email or web address]  Nadean Corwin Keyandre Pileggi 09/20/2019, 10:12 AM oco

## 2019-10-13 ENCOUNTER — Telehealth: Payer: Self-pay | Admitting: Internal Medicine

## 2019-10-13 NOTE — Telephone Encounter (Addendum)
Correspondence received from [employer] that Praluent 75mg/mL is non-preferred drug. Repatha is preferred.   Will route to MD to advise on change 

## 2019-10-13 NOTE — Telephone Encounter (Signed)
Ok to switch to Praluent 150 mg q2w ° °Dr H °

## 2019-10-14 MED ORDER — REPATHA SURECLICK 140 MG/ML ~~LOC~~ SOAJ: 1.0000 | SUBCUTANEOUS | 11 refills | Status: DC

## 2019-10-14 NOTE — Telephone Encounter (Addendum)
MyChart message sent to patient about changing PCSK9 from Praluent 75mg /mL to Repatha 140mg /mL q2weeks

## 2019-10-14 NOTE — Telephone Encounter (Signed)
Patient is OK with med change to Repatha. Rx(s) sent to pharmacy electronically.

## 2019-11-16 ENCOUNTER — Telehealth: Payer: Self-pay | Admitting: Internal Medicine

## 2019-11-16 NOTE — Telephone Encounter (Signed)
PA for Repatha submitted via CMM PA Case: [id number], Status: Approved, Coverage Starts on: 11/16/2019 12:00:00 AM, Coverage Ends on: 05/14/2020 12:00:00 AM  ID: [id number] BIN: [id number] PCN: [id number] RxGrp: [id number]

## 2019-11-29 ENCOUNTER — Telehealth: Payer: Self-pay | Admitting: Internal Medicine

## 2019-11-29 NOTE — Telephone Encounter (Signed)
PA for praluent 75mg /mL submitted via CMM (Key )  Patient was previously on this medication - d/t change in insurance formulary, changed to Repatha which patient cannot tolerate d/t back pain

## 2019-12-01 MED ORDER — PRALUENT 75 MG/ML ~~LOC~~ SOAJ: 1.0000 | SUBCUTANEOUS | 11 refills | Status: DC

## 2019-12-01 NOTE — Telephone Encounter (Signed)
Rx sent to [facility]

## 2019-12-01 NOTE — Addendum Note (Signed)
Addended by: Lindell Spar on: 12/01/2019 09:30 AM   Modules accepted: Orders

## 2019-12-01 NOTE — Telephone Encounter (Addendum)
PA Case: [id number], Status: Approved, Coverage Starts on: 06/17/2019 12:00:00 AM, Coverage Ends on: 06/15/2020 12:00:00 AM  Notified patient via MyChart

## 2019-12-29 IMAGING — CT CT HEART SCORING
2 series · 16 of 20 positions shown, 18 images · non-contrast
Comparison: None.
COMPARISON: None.

Addendum:
EXAM:
OVER-READ INTERPRETATION  CT CHEST

The following report is an over-read performed by radiologist Dr.
Kompany Tenzin [REDACTED] on 12/29/2018. This over-read
does not include interpretation of cardiac or coronary anatomy or
pathology. The coronary calcium score interpretation by the
cardiologist is attached.
CLINICAL DATA: Risk stratification
Coronary Calcium Score
TECHNIQUE: The patient was scanned on a Siemens Force scanner. Axial
non-contrast 3 mm slices were carried out through the heart. The
data set was analyzed on a dedicated work station and scored using
the Agatson method.

[Series 2: casc 3.0 i36f 2 bestdiast 65 % · axial · 0.30mm/px · z∈[-217,-118]mm · 8 of 43 slices shown, 10 images]
[im 5/43  vessel]
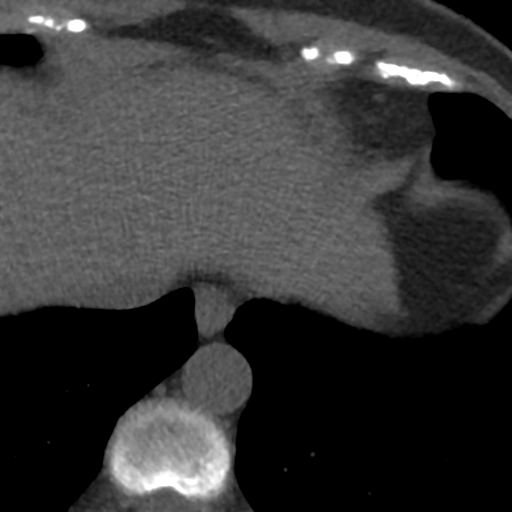
[im 5/43  lung]
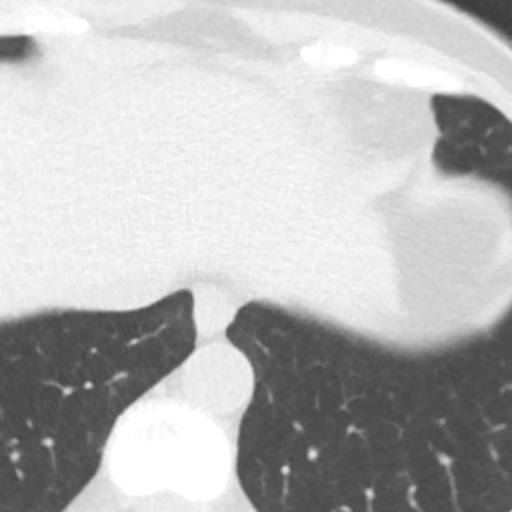
[im 10/43  vessel]
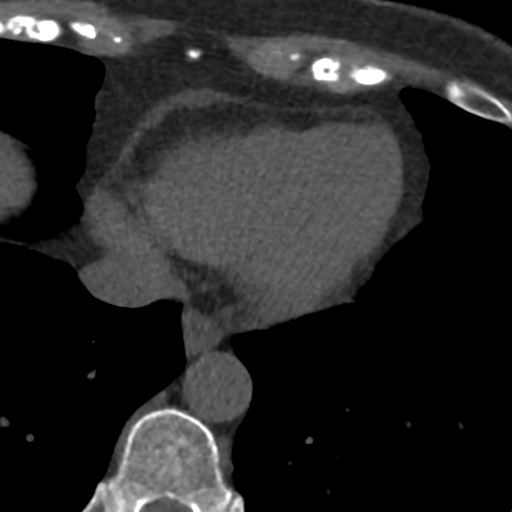
[im 15/43  vessel]
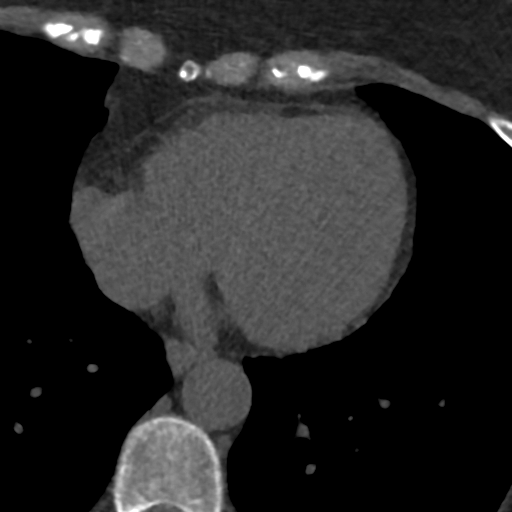
[im 19/43  vessel]
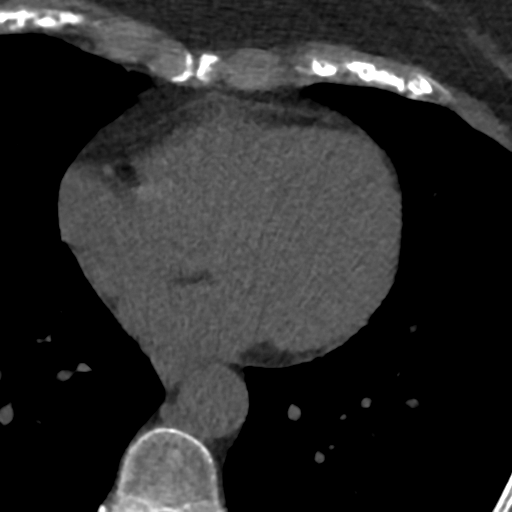
[im 24/43  vessel]
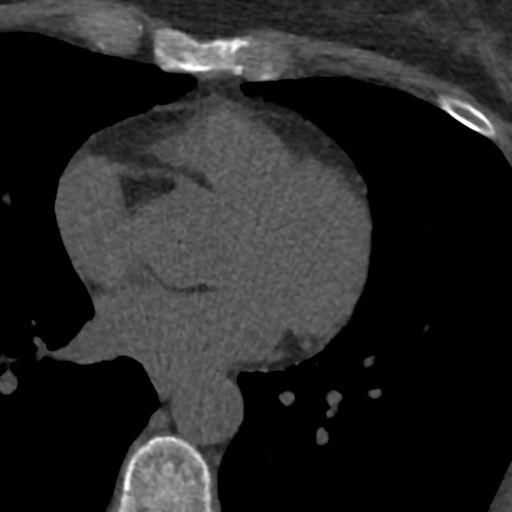
[im 24/43  lung]
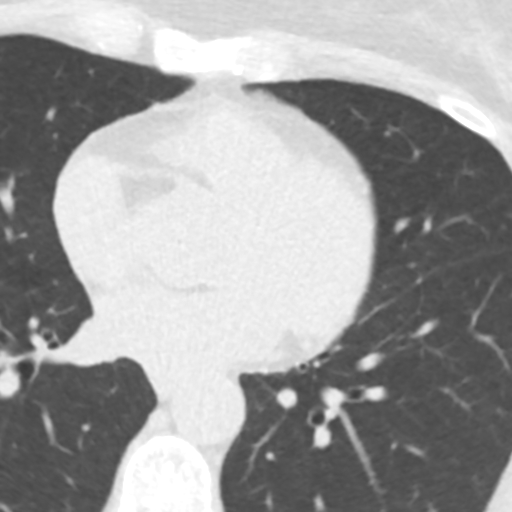
[im 29/43  vessel]
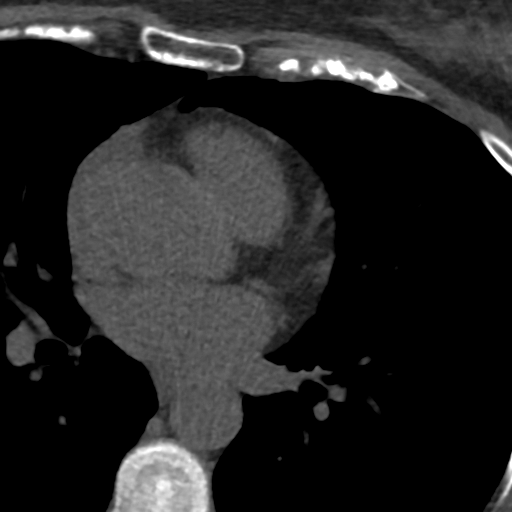
[im 33/43  vessel]
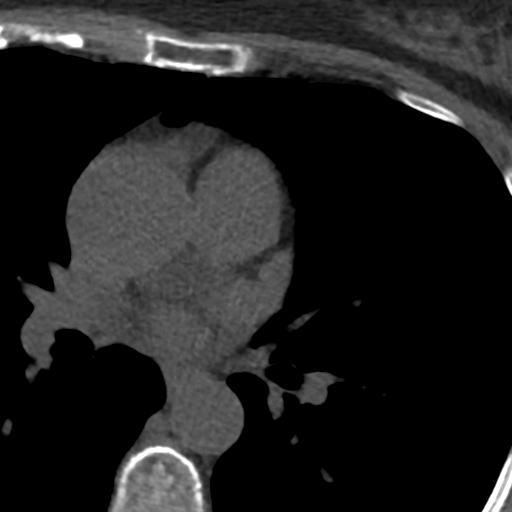
[im 38/43  vessel]
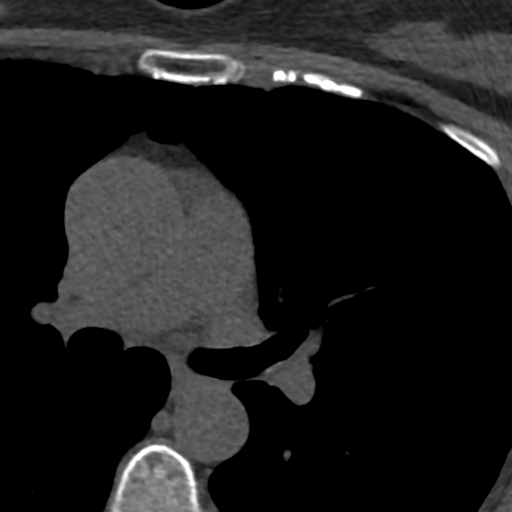

[Series 4: lung st 66 % · axial · 0.62mm/px · z∈[-217,-118]mm · 8 of 43 slices shown]
[im 5/43  lung]
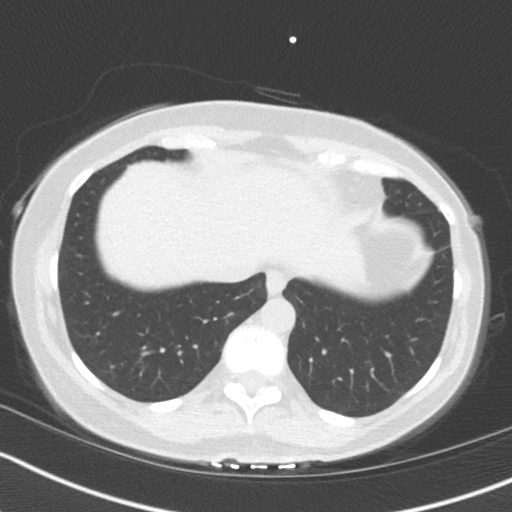
[im 10/43  lung]
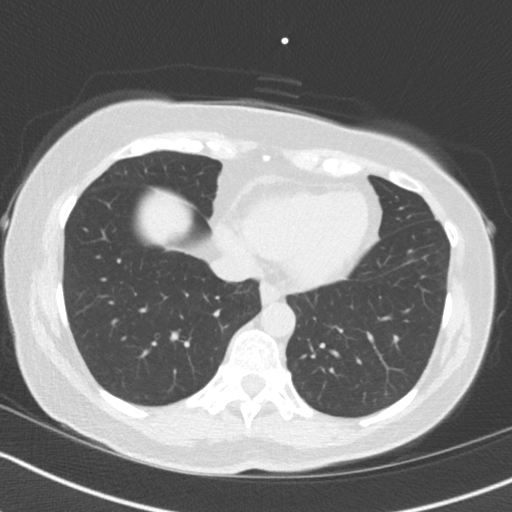
[im 15/43  lung]
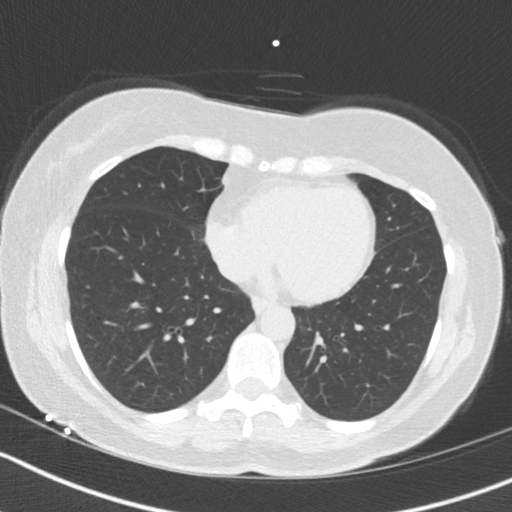
[im 19/43  lung]
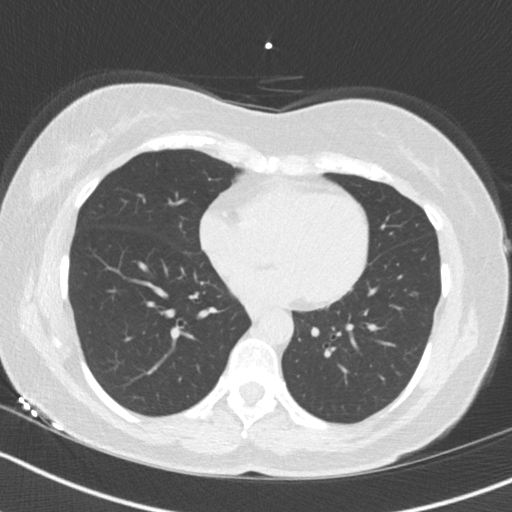
[im 24/43  lung]
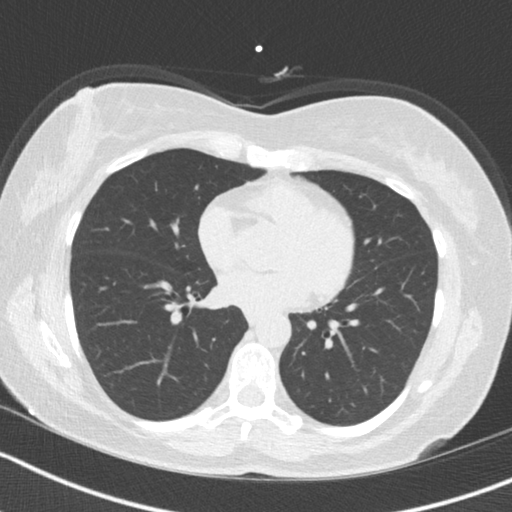
[im 29/43  lung]
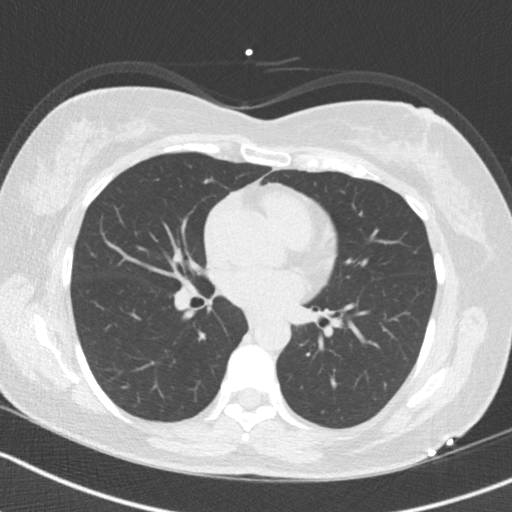
[im 33/43  lung]
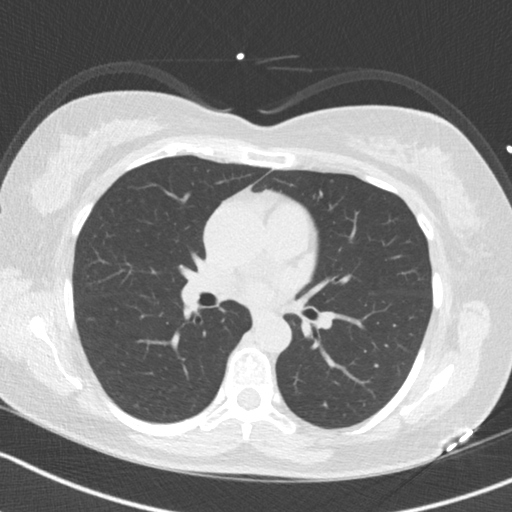
[im 38/43  lung]
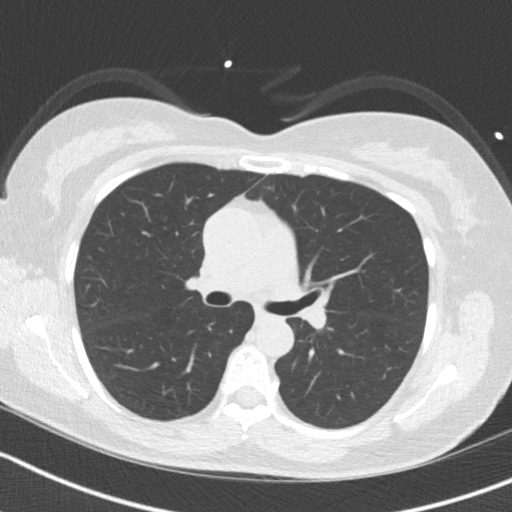

[16 of 20 positions shown; findings below may reference images not displayed]

FINDINGS: Vascular: Heart is normal size.  Visualized aorta is normal caliber.

Mediastinum/Nodes: No adenopathy in the lower mediastinum or hila.

Lungs/Pleura: Visualized lungs clear.  No effusions.

Upper Abdomen: Imaging into the upper abdomen shows no acute
findings.

Musculoskeletal: Chest wall soft tissues are unremarkable. No acute
bony abnormality.
IMPRESSION: No acute or significant extracardiac abnormality.
FINDINGS: Non-cardiac: See separate report from [REDACTED].

Ascending Aorta: Normal size, no calcifications.

Pericardium: Normal.

Coronary arteries: Normal origin.
IMPRESSION: Coronary calcium score of 21. This was 64 percentile for age and sex
matched control.

*** End of Addendum ***
EXAM:
OVER-READ INTERPRETATION  CT CHEST

The following report is an over-read performed by radiologist Dr.
Kompany Tenzin [REDACTED] on 12/29/2018. This over-read
does not include interpretation of cardiac or coronary anatomy or
pathology. The coronary calcium score interpretation by the
cardiologist is attached.
FINDINGS: Vascular: Heart is normal size.  Visualized aorta is normal caliber.

Mediastinum/Nodes: No adenopathy in the lower mediastinum or hila.

Lungs/Pleura: Visualized lungs clear.  No effusions.

Upper Abdomen: Imaging into the upper abdomen shows no acute
findings.

Musculoskeletal: Chest wall soft tissues are unremarkable. No acute
bony abnormality.
IMPRESSION: No acute or significant extracardiac abnormality.

## 2020-04-14 LAB — LIPID PANEL
Chol/HDL Ratio: 2 ratio (ref 0.0–4.4)
Cholesterol, Total: 110 mg/dL (ref 100–199)
HDL: 54 mg/dL (ref >39)
LDL Chol Calc (NIH): 38 mg/dL (ref 0–99)
Triglycerides: 96 mg/dL (ref 0–149)
VLDL Cholesterol Cal: 18 mg/dL (ref 5–40)

## 2020-04-18 ENCOUNTER — Encounter: Payer: Self-pay | Admitting: Internal Medicine

## 2020-04-18 ENCOUNTER — Ambulatory Visit (INDEPENDENT_AMBULATORY_CARE_PROVIDER_SITE_OTHER): Payer: Medicare Other | Admitting: Internal Medicine

## 2020-04-18 ENCOUNTER — Other Ambulatory Visit: Payer: Self-pay

## 2020-04-18 VITALS — BP 102/62 | HR 63 | Ht 61.5 in | Wt 122.0 lb

## 2020-04-18 DIAGNOSIS — R931 Abnormal findings on diagnostic imaging of heart and coronary circulation: Secondary | ICD-10-CM | POA: Diagnosis not present

## 2020-04-18 DIAGNOSIS — E785 Hyperlipidemia, unspecified: Secondary | ICD-10-CM | POA: Diagnosis not present

## 2020-04-18 DIAGNOSIS — M791 Myalgia, unspecified site: Secondary | ICD-10-CM

## 2020-04-18 DIAGNOSIS — T466X5A Adverse effect of antihyperlipidemic and antiarteriosclerotic drugs, initial encounter: Secondary | ICD-10-CM

## 2020-04-18 DIAGNOSIS — Z789 Other specified health status: Secondary | ICD-10-CM

## 2020-04-18 NOTE — Progress Notes (Signed)
LIPID CLINIC CONSULT NOTE  Chief Complaint:  Follow- up dyslipidemia  Primary Care Physician: Mosetta Putt, MD  Primary Cardiologist:  No primary care provider on file.  HPI:  Sherry Ford is a 64 y.o. female who is being seen today for the evaluation of dyslipidemia at the request of Mosetta Putt, MD.  This is a pleasant 64 year old female patient kindly referred to the advanced lipid disorders clinic for management of dyslipidemia.  According to her PCP Dr. Duaine Dredge, she has a longstanding diagnosis of mixed hyperlipidemia however has been intolerant to statins causing myalgias, fatigue and muscle weakness.  These seem to improve after discontinuing the medication and start up again when restarting the medicines.  Her last lipid NMR showed an elevated LDL particle number of 2517 with an LDL-C of 151.  She is previously been on atorvastatin, rosuvastatin, Livalo and ezetimibe.  She is currently not on any medications.  She was willing to consider PCSK9 inhibitor however was denied due to lack of cardiovascular disease.  Currently she reports being asymptomatic, denying any chest pain or worsening shortness of breath.  She does say that there is a family history of elevated cholesterol, although her father died at 96 of colon cancer, her brother died at age 40 with non-Hodgkin's lymphoma secondary to HIV disease, so there is no significant early onset heart disease that I could find.  04/04/2019  Sherry Ford returns today for follow-up of dyslipidemia.  She underwent coronary artery calcium scoring which was mildly elevated showing a score of 21 which was 64th percentile for age and sex matched control.  Based on some elevated coronary calcium and elevated LDL-C of 151, I recommended PCSK9 inhibitor however this was denied by her insurance company.  Subsequently were were able to get approval for Nexletol 180 mg daily.  She said she took this for several weeks to a month and  noticed initially some dizziness with the medication but that improved somewhat and then ultimately developed some nausea.  She said this was thought to be medication side effect of the Nexletol discontinue the medicine and it did improve.  I thought it was unusual that she thought she had 2 separate side effects related to the one medication and perhaps it was not a medication side effect at all.  She has been off the medicine for more than a month therefore it is not likely to be of benefit to recheck her cholesterol.  Previously she had taken ezetimibe she said which she tolerated but was not currently taking.  09/20/2019  Sherry Ford is seen today in follow-up.  Unfortunately she could not tolerate Nexletol.  She has been on ezetimibe.  This has improved her lipids in addition to dietary changes.  Her most recent lipid NMR showed a decrease in particle number from [phone].  LDL-C is 111.  Particle numbers are also improved from 1130-777 for small LDL-P.  All this is some improvement, she remains above target LDL less than 100.  Unfortunately she was statin intolerant as well and her options for further therapy are quite limited.  She previously had been denied PCSK9 inhibitor however now that she is on maximal therapy without other options I think this is the only additional option that we could provide her.  04/18/2020  Sherry Ford returns today for follow-up.  I am pleased to report she has had an excellent response to Praluent.  Unfortunately she had side effects including severe back pain on Repatha but previously  had been tolerating Praluent.  The switch was necessitated due to a change in her insurance.  Based on this we were able to convince the insurance to permit her to go back on Praluent 75 mg every 2 weeks.  She seems to be tolerating this well and has had a marked reduction in her lipids with total cholesterol 110, triglycerides 96, HDL 54 and LDL 38.  PMHx:  Past Medical History:   Diagnosis Date  . Depression   . History of alcohol abuse   . Interstitial cystitis   . Mixed hyperlipidemia   . Renal stone   . 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  FAMHx:  Family History  Problem Relation Age of Onset  . Breast cancer Mother   . Colon cancer Father   . HIV/AIDS Brother     SOCHx:   reports that she has never smoked. She has never used smokeless tobacco. She reports previous alcohol use. She reports that she does not use drugs.  ALLERGIES:  Allergies  Allergen Reactions  . Dairy Aid [Lactase] Anaphylaxis  . Latex Shortness Of Breath    Throat itching.    . Bempedoic Acid Nausea Only  . Gluten Meal Nausea And Vomiting  . Repatha [Evolocumab]     Back pain    ROS: Pertinent items noted in HPI and remainder of comprehensive ROS otherwise negative.  HOME MEDS: Current Outpatient Medications on File Prior to Visit  Medication Sig Dispense Refill  . Alirocumab (PRALUENT) 75 MG/ML SOAJ Inject 1 Dose into the skin every 14 (fourteen) days. 2 pen 11  . buPROPion (WELLBUTRIN XL) 300 MG 24 hr tablet Take 1 tablet by mouth daily.    . cholecalciferol (VITAMIN D) 1000 units tablet Take 2,000 Units by mouth daily.    . cyanocobalamin (,VITAMIN B-12,) 1000 MCG/ML injection Inject 1,000 mcg into the muscle every 14 (fourteen) days.  3  . ezetimibe (ZETIA) 10 MG tablet Take 1 tablet (10 mg total) by mouth daily. 90 tablet 3  . FLUoxetine (PROZAC) 20 MG tablet     . hydrOXYzine (ATARAX/VISTARIL) 10 MG tablet TAKE 1 TABLET(10 MG) BY MOUTH EVERY NIGHT    . ibuprofen (ADVIL,MOTRIN) 200 MG tablet Take 400 mg by mouth every 6 (six) hours as needed for moderate pain.    Marland Kitchen PROAIR RESPICLICK 108 (90 Base) MCG/ACT AEPB Inhale 2 puffs into the lungs every 4 (four) hours as needed for wheezing or shortness of breath.  1   No current facility-administered medications on file prior to visit.    LABS/IMAGING: No  results found for this or any previous visit (from the past 48 hour(s)). No results found.  LIPID PANEL:    Component Value Date/Time   CHOL 110 04/13/2020 1126   TRIG 96 04/13/2020 1126   HDL 54 04/13/2020 1126   CHOLHDL 2.0 04/13/2020 1126   LDLCALC 38 04/13/2020 1126    WEIGHTS: Wt Readings from Last 3 Encounters:  04/18/20 122 lb (55.3 kg)  09/20/19 125 lb 12.8 oz (57.1 kg)  04/04/19 126 lb (57.2 kg)    VITALS: BP 102/62   Pulse 63   Ht 5' 1.5" (1.562 m)   Wt 122 lb (55.3 kg)   SpO2 97%   BMI 22.68 kg/m   EXAM: Deferred  EKG: N/A  ASSESSMENT: 1. Mixed dyslipidemia 2. Statin intolerant 3. Nexletol intolerant 4. Intermediate 10-year risk by pooled cohort equation (7.5%)  PLAN: 1.   Ms. Yong Ford has been doing well and tolerating Praluent with marked reduction in her lipids.  Her LDL is well below goal.  She denies any side effects with the medication.  At some point if her LDL cholesterol drops below 25, would recommend discontinuing ezetimibe.  Otherwise plan on continuing her current therapy.  Follow-up with me annually or sooner as necessary.  Chrystie Nose, MD, [facility], FACP  [place]  [facility]  Medical Director of the Advanced Lipid Disorders &  Cardiovascular Risk Reduction Clinic Diplomate of the American Board of Clinical Lipidology Attending Cardiologist  Direct Dial: [phone]  Fax: [phone]  Website:  [email or web address] Nicely Laveyah Oriol 04/18/2020, 3:43 PM oco

## 2020-04-18 NOTE — Patient Instructions (Signed)
  Follow-Up: At [facility], you and your health needs are our priority.  As part of our continuing mission to provide you with exceptional heart care, we have created designated Provider Care Teams.  These Care Teams include your primary Cardiologist (physician) and Advanced Practice Providers (APPs -  Physician Assistants and Nurse Practitioners) who all work together to provide you with the care you need, when you need it.  We recommend signing up for the patient portal called "MyChart".  Sign up information is provided on this After Visit Summary.  MyChart is used to connect with patients for Virtual Visits (Telemedicine).  Patients are able to view lab/test results, encounter notes, upcoming appointments, etc.  Non-urgent messages can be sent to your provider as well.   To learn more about what you can do with MyChart, go to [email or web address].    Your next appointment:   12 month(s)  The format for your next appointment:   In Person  Provider:   K. Chad Hilty, MD   

## 2020-05-16 ENCOUNTER — Other Ambulatory Visit: Payer: Self-pay | Admitting: Internal Medicine

## 2020-05-16 ENCOUNTER — Telehealth: Payer: Self-pay | Admitting: Internal Medicine

## 2020-05-16 NOTE — Telephone Encounter (Signed)
PA for praluent 75mg /mL submitted via CMM (Key: [id number]) - 

## 2020-05-16 NOTE — Telephone Encounter (Signed)
PA Case: [id number], Status: Approved, Coverage Starts on: 06/17/2019 12:00:00 AM, Coverage Ends on: 06/15/2021 12:00:00 AM

## 2020-11-20 ENCOUNTER — Other Ambulatory Visit: Payer: Self-pay | Admitting: Internal Medicine

## 2021-02-19 ENCOUNTER — Telehealth: Payer: Self-pay | Admitting: Internal Medicine

## 2021-02-19 DIAGNOSIS — E785 Hyperlipidemia, unspecified: Secondary | ICD-10-CM

## 2021-02-19 DIAGNOSIS — Z789 Other specified health status: Secondary | ICD-10-CM

## 2021-02-19 NOTE — Telephone Encounter (Cosign Needed)
Patient is due for 1 year lipid clinic visit  Lipid panel ordered MyChart message sent to patient notifying her to schedule appointment/complete FLP

## 2021-04-17 ENCOUNTER — Encounter: Payer: Self-pay | Admitting: Internal Medicine

## 2021-04-17 ENCOUNTER — Ambulatory Visit (INDEPENDENT_AMBULATORY_CARE_PROVIDER_SITE_OTHER): Payer: Medicare Other | Admitting: Internal Medicine

## 2021-04-17 ENCOUNTER — Other Ambulatory Visit: Payer: Self-pay

## 2021-04-17 VITALS — BP 115/70 | HR 74 | Ht 62.0 in | Wt 119.6 lb

## 2021-04-17 DIAGNOSIS — M791 Myalgia, unspecified site: Secondary | ICD-10-CM | POA: Diagnosis not present

## 2021-04-17 DIAGNOSIS — T466X5D Adverse effect of antihyperlipidemic and antiarteriosclerotic drugs, subsequent encounter: Secondary | ICD-10-CM | POA: Diagnosis not present

## 2021-04-17 DIAGNOSIS — R931 Abnormal findings on diagnostic imaging of heart and coronary circulation: Secondary | ICD-10-CM | POA: Diagnosis not present

## 2021-04-17 DIAGNOSIS — E785 Hyperlipidemia, unspecified: Secondary | ICD-10-CM | POA: Diagnosis not present

## 2021-04-17 NOTE — Patient Instructions (Signed)
Medication Instructions:  Your physician recommends that you continue on your current medications as directed. Please refer to the Current Medication list given to you today.  *If you need a refill on your cardiac medications before your next appointment, please call your pharmacy*   Lab Work: FASTING lab work to check cholesterol before your next visit, in 1 year   If you have labs (blood work) drawn today and your tests are completely normal, you will receive your results only by: MyChart Message (if you have MyChart) OR A paper copy in the mail If you have any lab test that is abnormal or we need to change your treatment, we will call you to review the results.   Testing/Procedures: NONE   Follow-Up: At [facility], you and your health needs are our priority.  As part of our continuing mission to provide you with exceptional heart care, we have created designated Provider Care Teams.  These Care Teams include your primary Cardiologist (physician) and Advanced Practice Providers (APPs -  Physician Assistants and Nurse Practitioners) who all work together to provide you with the care you need, when you need it.  We recommend signing up for the patient portal called "MyChart".  Sign up information is provided on this After Visit Summary.  MyChart is used to connect with patients for Virtual Visits (Telemedicine).  Patients are able to view lab/test results, encounter notes, upcoming appointments, etc.  Non-urgent messages can be sent to your provider as well.   To learn more about what you can do with MyChart, go to [email or web address].    Your next appointment:   12 month(s)  The format for your next appointment:   In Person  Provider:   K. [place] Hilty, MD   Other Instructions

## 2021-04-17 NOTE — Progress Notes (Signed)
LIPID CLINIC CONSULT NOTE  Chief Complaint:  Follow- up dyslipidemia  Primary Care Physician: Mosetta Putt, MD  Primary Cardiologist:  None  HPI:  Sherry Ford is a 65 y.o. female who is being seen today for the evaluation of dyslipidemia at the request of Mosetta Putt, MD.  This is a pleasant 65 year old female patient kindly referred to the advanced lipid disorders clinic for management of dyslipidemia.  According to her PCP Dr. Duaine Dredge, she has a longstanding diagnosis of mixed hyperlipidemia however has been intolerant to statins causing myalgias, fatigue and muscle weakness.  These seem to improve after discontinuing the medication and start up again when restarting the medicines.  Her last lipid NMR showed an elevated LDL particle number of 2517 with an LDL-C of 151.  She is previously been on atorvastatin, rosuvastatin, Livalo and ezetimibe.  She is currently not on any medications.  She was willing to consider PCSK9 inhibitor however was denied due to lack of cardiovascular disease.  Currently she reports being asymptomatic, denying any chest pain or worsening shortness of breath.  She does say that there is a family history of elevated cholesterol, although her father died at 45 of colon cancer, her brother died at age 52 with non-Hodgkin's lymphoma secondary to HIV disease, so there is no significant early onset heart disease that I could find.  04/04/2019  Mrs. Sherry Ford returns today for follow-up of dyslipidemia.  She underwent coronary artery calcium scoring which was mildly elevated showing a score of 21 which was 64th percentile for age and sex matched control.  Based on some elevated coronary calcium and elevated LDL-C of 151, I recommended PCSK9 inhibitor however this was denied by her insurance company.  Subsequently were were able to get approval for Nexletol 180 mg daily.  She said she took this for several weeks to a month and noticed initially some dizziness  with the medication but that improved somewhat and then ultimately developed some nausea.  She said this was thought to be medication side effect of the Nexletol discontinue the medicine and it did improve.  I thought it was unusual that she thought she had 2 separate side effects related to the one medication and perhaps it was not a medication side effect at all.  She has been off the medicine for more than a month therefore it is not likely to be of benefit to recheck her cholesterol.  Previously she had taken ezetimibe she said which she tolerated but was not currently taking.  09/20/2019  Mrs. Sherry Ford is seen today in follow-up.  Unfortunately she could not tolerate Nexletol.  She has been on ezetimibe.  This has improved her lipids in addition to dietary changes.  Her most recent lipid NMR showed a decrease in particle number from [phone].  LDL-C is 111.  Particle numbers are also improved from 1130-777 for small LDL-P.  All this is some improvement, she remains above target LDL less than 100.  Unfortunately she was statin intolerant as well and her options for further therapy are quite limited.  She previously had been denied PCSK9 inhibitor however now that she is on maximal therapy without other options I think this is the only additional option that we could provide her.  04/18/2020  Mrs. Sherry Ford returns today for follow-up.  I am pleased to report she has had an excellent response to Praluent.  Unfortunately she had side effects including severe back pain on Repatha but previously had been tolerating Praluent.  The switch was necessitated due to a change in her insurance.  Based on this we were able to convince the insurance to permit her to go back on Praluent 75 mg every 2 weeks.  She seems to be tolerating this well and has had a marked reduction in her lipids with total cholesterol 110, triglycerides 96, HDL 54 and LDL 38.  04/17/2021  Mrs. Sherry Ford returns today for follow-up.  She is  doing well.  He continues to have an excellent response to her Praluent and ezetimibe.  Her lab work was just performed by her primary care provider.  This showed total cholesterol 118, HDL 55, triglycerides 114 and LDL 43.  This is despite even missing 1 dose due to not having the medication filled.  Her LDL-P is 870, small LDL-P at 356, LDL size is 67.1 Angstrom.  She notes no side effects related to the medicine.  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  FAMHx:  Family History  Problem Relation Age of Onset   Breast cancer Mother    Colon cancer Father    HIV/AIDS Brother     SOCHx:   reports that she has never smoked. She has never used smokeless tobacco. She reports that she does not currently use alcohol. She reports that she does not use drugs.  ALLERGIES:  Allergies  Allergen Reactions   Dairy Aid [Tilactase] Anaphylaxis   Latex Shortness Of Breath    Throat itching.     Bempedoic Acid Nausea Only   Gluten Meal Nausea And Vomiting   Repatha [Evolocumab]     Back pain    ROS: Pertinent items noted in HPI and remainder of comprehensive ROS otherwise negative.  HOME MEDS: Current Outpatient Medications on File Prior to Visit  Medication Sig Dispense Refill   buPROPion (WELLBUTRIN XL) 300 MG 24 hr tablet Take 1 tablet by mouth daily.     cholecalciferol (VITAMIN D) 1000 units tablet Take 2,000 Units by mouth daily.     cyanocobalamin (,VITAMIN B-12,) 1000 MCG/ML injection Inject 1,000 mcg into the muscle every 14 (fourteen) days.  3   ezetimibe (ZETIA) 10 MG tablet TAKE 1 TABLET(10 MG) BY MOUTH DAILY 90 tablet 3   FLUoxetine (PROZAC) 20 MG tablet      hydrOXYzine (ATARAX/VISTARIL) 10 MG tablet TAKE 1 TABLET(10 MG) BY MOUTH EVERY NIGHT     ibuprofen  (ADVIL,MOTRIN) 200 MG tablet Take 400 mg by mouth every 6 (six) hours as needed for moderate pain.     PRALUENT 75 MG/ML SOAJ INJECT 1 DOSE INTO THE SKIN EVERY 14 DAYS 2 mL 11   PROAIR RESPICLICK 108 (90 Base) MCG/ACT AEPB Inhale 2 puffs into the lungs every 4 (four) hours as needed for wheezing or shortness of breath.  1   No current facility-administered medications on file prior to visit.    LABS/IMAGING: No results found for this or any previous visit (from the past 48 hour(s)). No results found.  LIPID PANEL:    Component Value Date/Time   CHOL 110 04/13/2020 1126   TRIG 96 04/13/2020 1126   HDL 54 04/13/2020 1126   CHOLHDL 2.0 04/13/2020 1126   LDLCALC 38 04/13/2020 1126    WEIGHTS:  Wt Readings from Last 3 Encounters:  04/17/21 119 lb 9.6 oz (54.3 kg)  04/18/20 122 lb (55.3 kg)  09/20/19 125 lb 12.8 oz (57.1 kg)    VITALS: BP 115/70 (BP Location: Left Arm)   Pulse 74   Ht 5\' 2"  (1.575 m)   Wt 119 lb 9.6 oz (54.3 kg)   SpO2 98%   BMI 21.88 kg/m   EXAM: Deferred  EKG: N/A  ASSESSMENT: Mixed dyslipidemia Statin intolerant Nexletol intolerant Intermediate 10-year risk by pooled cohort equation (7.5%) Abnormal CAC score of 21, 64th percentile for age and sex matched controls (2020)  PLAN: 1.   Ms. Ford continues to do very well on combination of ezetimibe and Praluent.  Her LDL particle numbers are low and cholesterol is well controlled.  She did have a calcium score of 21, which was 64th percentile for age and sex matched controls.  We will continue with this aggressive therapy.  I do not think necessarily will need to consider repeat calcium scoring however would not recommend it any earlier than 7 to 10 years from now.  Follow-up with me annually or sooner as necessary.  Willow Ora, MD, [facility], FACP  [place]  [facility]  Medical Director of the Advanced Lipid Disorders &  Cardiovascular Risk Reduction Clinic Diplomate of the American  Board of Clinical Lipidology Attending Cardiologist  Direct Dial: [phone]  Fax: [phone]  Website:  [email or web address] Thula Stewart 04/17/2021, 9:39 AM oco

## 2021-05-04 ENCOUNTER — Other Ambulatory Visit: Payer: Self-pay | Admitting: Internal Medicine

## 2021-08-20 ENCOUNTER — Telehealth: Payer: Self-pay | Admitting: Internal Medicine

## 2021-08-20 NOTE — Telephone Encounter (Signed)
PA for praluent 75mg /mL submitted via CMM ?(Key: ) ?

## 2021-08-21 NOTE — Telephone Encounter (Signed)
Approved on March 7 ?PA Case: [id number], Status: Approved, Coverage Starts on: 06/16/2021 12:00:00 AM, Coverage Ends on: 06/15/2022 12:00:00 AM. ?

## 2021-09-03 ENCOUNTER — Other Ambulatory Visit: Payer: Self-pay | Admitting: Internal Medicine

## 2021-09-30 ENCOUNTER — Other Ambulatory Visit: Payer: Self-pay | Admitting: Internal Medicine

## 2021-09-30 NOTE — Telephone Encounter (Signed)
Med was refilled March 2023 ? 2 mL 11 09/03/2021  ? ?Patient intolerant to [place] ?

## 2021-10-07 ENCOUNTER — Encounter: Payer: Self-pay | Admitting: Internal Medicine

## 2022-04-19 ENCOUNTER — Other Ambulatory Visit: Payer: Self-pay | Admitting: Internal Medicine

## 2022-07-11 ENCOUNTER — Other Ambulatory Visit: Payer: Self-pay | Admitting: Internal Medicine

## 2022-07-14 ENCOUNTER — Other Ambulatory Visit: Payer: Self-pay | Admitting: Internal Medicine

## 2022-07-16 ENCOUNTER — Other Ambulatory Visit: Payer: Self-pay | Admitting: Internal Medicine

## 2022-08-19 ENCOUNTER — Telehealth: Payer: Self-pay | Admitting: Internal Medicine

## 2022-08-19 ENCOUNTER — Other Ambulatory Visit (HOSPITAL_COMMUNITY): Payer: Self-pay

## 2022-08-19 NOTE — Telephone Encounter (Signed)
New Message:   Reference # [id number]     She needs prior authorization     *STAT* If patient is at the pharmacy, call can be transferred to refill team.   1. Which medications need to be refilled? (please list name of each medication and dose if known) Praluent 75 mg  2. Which pharmacy/location (including street and city if local pharmacy) is medication to be sent to? [facility]  3. Do they need a 30 day or 90 day supply?  Need prior authorization for Praluent 75 mg

## 2022-08-20 ENCOUNTER — Telehealth: Payer: Self-pay

## 2022-08-20 MED ORDER — PRALUENT 75 MG/ML ~~LOC~~ SOAJ: SUBCUTANEOUS | 11 refills | Status: DC

## 2022-08-20 NOTE — Telephone Encounter (Signed)
Pt notified PA approved, refill sent in to pharmacy.

## 2022-08-20 NOTE — Telephone Encounter (Signed)
   P/A APPROVED 1.1.24 THROUGH 12.31.24 KEY : [id number]

## 2022-08-20 NOTE — Telephone Encounter (Signed)
Pharmacy Patient Advocate Encounter  Prior Authorization for Praluent '75MG'$ /ML has been approved by [employer] (ins).    PA # [id number] Effective dates: 1.1.24 through 12.31.24

## 2022-09-04 ENCOUNTER — Other Ambulatory Visit: Payer: Self-pay | Admitting: Internal Medicine

## 2022-09-16 ENCOUNTER — Other Ambulatory Visit: Payer: Self-pay | Admitting: Internal Medicine

## 2022-11-15 ENCOUNTER — Other Ambulatory Visit: Payer: Self-pay | Admitting: Internal Medicine

## 2022-12-15 ENCOUNTER — Other Ambulatory Visit: Payer: Self-pay | Admitting: Internal Medicine

## 2023-02-20 ENCOUNTER — Other Ambulatory Visit: Payer: Self-pay | Admitting: Internal Medicine

## 2023-04-15 ENCOUNTER — Telehealth: Payer: Self-pay | Admitting: Pharmacy Technician

## 2023-04-15 ENCOUNTER — Telehealth: Payer: Self-pay | Admitting: Internal Medicine

## 2023-04-15 ENCOUNTER — Other Ambulatory Visit (HOSPITAL_COMMUNITY): Payer: Self-pay

## 2023-04-15 NOTE — Telephone Encounter (Signed)
Pharmacy Patient Advocate Encounter   Received notification from Pt Calls Messages that prior authorization for praluent is required/requested.   Insurance verification completed.   The patient is insured through [place] .   Per test claim: PA required; PA submitted to above mentioned insurance via CoverMyMeds Key/confirmation #/EOC [id number] Status is pending

## 2023-04-15 NOTE — Telephone Encounter (Signed)
Left detailed message for pt that Praluent PA has been approved.

## 2023-04-15 NOTE — Telephone Encounter (Signed)
Pharmacy Patient Advocate Encounter  Received notification from [facility] that Prior Authorization for praluent has been APPROVED from 06/16/22 to 06/15/24   PA #/Case ID/Reference #: [id number]

## 2023-04-15 NOTE — Telephone Encounter (Signed)
Pt c/o medication issue:  1. Name of Medication: PRALUENT 75 MG/ML SOAJ   2. How are you currently taking this medication (dosage and times per day)?   INJECT 1 DOSE UNDER THE SKIN EVERY 14 DAYS    3. Are you having a reaction (difficulty breathing--STAT)? No  4. What is your medication issue? Pt's insurance called stating that medication is no longer formulary and would ike to know if another medication is able to be prescribed. Please advise

## 2023-05-06 ENCOUNTER — Other Ambulatory Visit: Payer: Self-pay | Admitting: Internal Medicine

## 2023-05-06 DIAGNOSIS — E785 Hyperlipidemia, unspecified: Secondary | ICD-10-CM

## 2023-05-16 ENCOUNTER — Other Ambulatory Visit: Payer: Self-pay | Admitting: Internal Medicine

## 2023-05-20 LAB — LIPID PANEL
Chol/HDL Ratio: 2 {ratio} (ref 0.0–4.4)
Cholesterol, Total: 117 mg/dL (ref 100–199)
HDL: 60 mg/dL (ref >39)
LDL Chol Calc (NIH): 43 mg/dL (ref 0–99)
Triglycerides: 68 mg/dL (ref 0–149)
VLDL Cholesterol Cal: 14 mg/dL (ref 5–40)

## 2023-05-25 ENCOUNTER — Ambulatory Visit: Payer: Medicare Other | Attending: Internal Medicine | Admitting: Internal Medicine

## 2023-05-25 ENCOUNTER — Encounter: Payer: Self-pay | Admitting: Internal Medicine

## 2023-05-25 VITALS — BP 98/60 | HR 78 | Ht 62.0 in | Wt 116.0 lb

## 2023-05-25 DIAGNOSIS — M791 Myalgia, unspecified site: Secondary | ICD-10-CM | POA: Diagnosis present

## 2023-05-25 DIAGNOSIS — T466X5D Adverse effect of antihyperlipidemic and antiarteriosclerotic drugs, subsequent encounter: Secondary | ICD-10-CM | POA: Diagnosis not present

## 2023-05-25 DIAGNOSIS — T466X5A Adverse effect of antihyperlipidemic and antiarteriosclerotic drugs, initial encounter: Secondary | ICD-10-CM | POA: Insufficient documentation

## 2023-05-25 DIAGNOSIS — E785 Hyperlipidemia, unspecified: Secondary | ICD-10-CM | POA: Insufficient documentation

## 2023-05-25 DIAGNOSIS — I2510 Atherosclerotic heart disease of native coronary artery without angina pectoris: Secondary | ICD-10-CM | POA: Insufficient documentation

## 2023-05-25 NOTE — Progress Notes (Signed)
LIPID CLINIC CONSULT NOTE  Chief Complaint:  Follow- up dyslipidemia  Primary Care Physician: Mosetta Putt, MD  Primary Cardiologist:  None  HPI:  Sherry Ford is a 67 y.o. female who is being seen today for the evaluation of dyslipidemia at the request of Mosetta Putt, MD.  This is a pleasant 67 year old female patient kindly referred to the advanced lipid disorders clinic for management of dyslipidemia.  According to her PCP Dr. Duaine Dredge, she has a longstanding diagnosis of mixed hyperlipidemia however has been intolerant to statins causing myalgias, fatigue and muscle weakness.  These seem to improve after discontinuing the medication and start up again when restarting the medicines.  Her last lipid NMR showed an elevated LDL particle number of 2517 with an LDL-C of 151.  She is previously been on atorvastatin, rosuvastatin, Livalo and ezetimibe.  She is currently not on any medications.  She was willing to consider PCSK9 inhibitor however was denied due to lack of cardiovascular disease.  Currently she reports being asymptomatic, denying any chest pain or worsening shortness of breath.  She does say that there is a family history of elevated cholesterol, although her father died at 41 of colon cancer, her brother died at age 57 with non-Hodgkin's lymphoma secondary to HIV disease, so there is no significant early onset heart disease that I could find.  04/04/2019  Sherry Ford returns today for follow-up of dyslipidemia.  She underwent coronary artery calcium scoring which was mildly elevated showing a score of 21 which was 64th percentile for age and sex matched control.  Based on some elevated coronary calcium and elevated LDL-C of 151, I recommended PCSK9 inhibitor however this was denied by her insurance company.  Subsequently were were able to get approval for Nexletol 180 mg daily.  She said she took this for several weeks to a month and noticed initially some dizziness  with the medication but that improved somewhat and then ultimately developed some nausea.  She said this was thought to be medication side effect of the Nexletol discontinue the medicine and it did improve.  I thought it was unusual that she thought she had 2 separate side effects related to the one medication and perhaps it was not a medication side effect at all.  She has been off the medicine for more than a month therefore it is not likely to be of benefit to recheck her cholesterol.  Previously she had taken ezetimibe she said which she tolerated but was not currently taking.  09/20/2019  Sherry Ford is seen today in follow-up.  Unfortunately she could not tolerate Nexletol.  She has been on ezetimibe.  This has improved her lipids in addition to dietary changes.  Her most recent lipid NMR showed a decrease in particle number from [phone].  LDL-C is 111.  Particle numbers are also improved from 1130-777 for small LDL-P.  All this is some improvement, she remains above target LDL less than 100.  Unfortunately she was statin intolerant as well and her options for further therapy are quite limited.  She previously had been denied PCSK9 inhibitor however now that she is on maximal therapy without other options I think this is the only additional option that we could provide her.  04/18/2020  Sherry Ford returns today for follow-up.  I am pleased to report she has had an excellent response to Praluent.  Unfortunately she had side effects including severe back pain on Repatha but previously had been tolerating Praluent.  The  switch was necessitated due to a change in her insurance.  Based on this we were able to convince the insurance to permit her to go back on Praluent 75 mg every 2 weeks.  She seems to be tolerating this well and has had a marked reduction in her lipids with total cholesterol 110, triglycerides 96, HDL 54 and LDL 38.  04/17/2021  Sherry Ford returns today for follow-up.  She is  doing well.  He continues to have an excellent response to her Praluent and ezetimibe.  Her lab work was just performed by her primary care provider.  This showed total cholesterol 118, HDL 55, triglycerides 114 and LDL 43.  This is despite even missing 1 dose due to not having the medication filled.  Her LDL-P is 870, small LDL-P at 356, LDL size is 16.1 Angstrom.  She notes no side effects related to the medicine.  05/25/2023  Sherry Ford is seen today in follow-up.  She continues to do well on Praluent.  She is paying about 300 hours a month for the medication.  Her cholesterol has been well treated with total 117, triglycerides 68, HDL 60 and LDL 43.  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  FAMHx:  Family History  Problem Relation Age of Onset   Breast cancer Mother    Colon cancer Father    HIV/AIDS Brother     SOCHx:   reports that she has never smoked. She has never used smokeless tobacco. She reports that she does not currently use alcohol. She reports that she does not use drugs.  ALLERGIES:  Allergies  Allergen Reactions   Dairy Aid [Tilactase] Anaphylaxis   Latex Shortness Of Breath    Throat itching.     Bempedoic Acid Nausea Only   Gluten Meal Nausea And Vomiting   Repatha [Evolocumab]     Back pain    ROS: Pertinent items noted in HPI and remainder of comprehensive ROS otherwise negative.  HOME MEDS: Current Outpatient Medications on File Prior to Visit  Medication Sig Dispense Refill   buPROPion (WELLBUTRIN XL) 300 MG 24 hr tablet Take 1 tablet by mouth daily.     cholecalciferol (VITAMIN D) 1000 units tablet Take 2,000 Units by mouth daily.     cyanocobalamin (,VITAMIN B-12,) 1000 MCG/ML injection Inject 1,000 mcg into the muscle every 14  (fourteen) days.  3   ezetimibe (ZETIA) 10 MG tablet TAKE 1 TABLET(10 MG) BY MOUTH DAILY 90 tablet 1   FLUoxetine (PROZAC) 20 MG tablet      hydrOXYzine (ATARAX/VISTARIL) 10 MG tablet TAKE 1 TABLET(10 MG) BY MOUTH EVERY NIGHT     ibuprofen (ADVIL,MOTRIN) 200 MG tablet Take 400 mg by mouth every 6 (six) hours as needed for moderate pain.     PRALUENT 75 MG/ML SOAJ INJECT 1 DOSE UNDER THE SKIN EVERY 14 DAYS 2 mL 11   PROAIR RESPICLICK 108 (90 Base) MCG/ACT AEPB Inhale 2 puffs into the lungs every 4 (four) hours as needed for wheezing or shortness of breath.  1   No current facility-administered medications on file prior to visit.    LABS/IMAGING: No results found for this or any previous visit (from the past 48 hour(s)).  No results found.  LIPID PANEL:    Component Value Date/Time   CHOL 117 05/19/2023 0950   TRIG 68 05/19/2023 0950   HDL 60 05/19/2023 0950   CHOLHDL 2.0 05/19/2023 0950   LDLCALC 43 05/19/2023 0950    WEIGHTS: Wt Readings from Last 3 Encounters:  05/25/23 116 lb (52.6 kg)  04/17/21 119 lb 9.6 oz (54.3 kg)  04/18/20 122 lb (55.3 kg)    VITALS: BP 98/60   Pulse 78   Ht 5\' 2"  (1.575 m)   Wt 116 lb (52.6 kg)   SpO2 94%   BMI 21.22 kg/m   EXAM: Deferred  EKG: N/A  ASSESSMENT: Mixed dyslipidemia Statin intolerant Nexletol intolerant Intermediate 10-year risk by pooled cohort equation (7.5%) Abnormal CAC score of 21, 64th percentile for age and sex matched controls (2020)  PLAN: 1.   Ms. Ford continues to have well treated dyslipidemia.  She is essentially asymptomatic.  She has been under a lot of stress with her husband's chronic illness however her cholesterol has been well-controlled.  Will continue on Praluent since Repatha was not well-tolerated.  This is not the preferred medication for insurance however is covered under tier exception.  Follow-up with me annually or sooner as necessary.  Chrystie Nose, MD, [facility], FACP  [place]   [facility]  Medical Director of the Advanced Lipid Disorders &  Cardiovascular Risk Reduction Clinic Diplomate of the American Board of Clinical Lipidology Attending Cardiologist  Direct Dial: [phone]  Fax: [phone]  Website:  [email or web address] Tyger [email or web address] 05/25/2023, 9:36 AM oco

## 2023-05-25 NOTE — Patient Instructions (Signed)
Medication Instructions:  NO CHANGES   The [employer] Foundation offers assistance to help pay for medication copays.  They will cover copays for all cholesterol lowering meds, including statins, fibrates, omega-3 fish oils like Vascepa, ezetimibe, Repatha, Praluent, Nexletol, Nexlizet.  The cards are usually good for $2,500 or 12 months, whichever comes first. Our fax # is [phone] (you will need this to apply) Go to healthwellfoundation.org Click on "Apply Now" Answer questions as to whom is applying (patient or representative) Your disease fund will be "hypercholesterolemia - Medicare access" They will ask questions about finances and which medications you are taking for cholesterol When you submit, the approval is usually within minutes.  You will need to print the card information from the site You will need to show this information to your pharmacy, they will bill your Medicare Part D plan first -then bill [employer] --for the copay.   You can also call them at [phone], although the hold times can be quite long.     *If you need a refill on your cardiac medications before your next appointment, please call your pharmacy*   Lab Work: FASTING lab work in 1 year  If you have labs (blood work) drawn today and your tests are completely normal, you will receive your results only by: MyChart Message (if you have MyChart) OR A paper copy in the mail If you have any lab test that is abnormal or we need to change your treatment, we will call you to review the results.    Follow-Up: At [facility], you and your health needs are our priority.  As part of our continuing mission to provide you with exceptional heart care, we have created designated Provider Care Teams.  These Care Teams include your primary Cardiologist (physician) and Advanced Practice Providers (APPs -  Physician Assistants and Nurse Practitioners) who all work together to provide you with the care you  need, when you need it.  We recommend signing up for the patient portal called "MyChart".  Sign up information is provided on this After Visit Summary.  MyChart is used to connect with patients for Virtual Visits (Telemedicine).  Patients are able to view lab/test results, encounter notes, upcoming appointments, etc.  Non-urgent messages can be sent to your provider as well.   To learn more about what you can do with MyChart, go to [email or web address].    Your next appointment:   12 months with Dr. Rennis Golden

## 2023-05-27 ENCOUNTER — Ambulatory Visit: Payer: BLUE CROSS/BLUE SHIELD | Admitting: Internal Medicine

## 2023-06-12 ENCOUNTER — Encounter: Payer: Self-pay | Admitting: Pharmacist

## 2023-06-12 NOTE — Telephone Encounter (Signed)
Error

## 2023-08-14 ENCOUNTER — Other Ambulatory Visit (HOSPITAL_COMMUNITY): Payer: Self-pay

## 2023-08-14 ENCOUNTER — Telehealth: Payer: Self-pay | Admitting: Pharmacy Technician

## 2023-08-14 NOTE — Telephone Encounter (Signed)
 Pharmacy Patient Advocate Encounter   Received notification from Pt Calls Messages that prior authorization for Praluent is required/requested.   Insurance verification completed.   The patient is insured through [place] .   Per test claim: Refill too soon. PA is not needed at this time. Medication was filled 07/27/23. Next eligible fill date is 08/17/23.   PA on file until 06/15/24

## 2023-09-30 ENCOUNTER — Other Ambulatory Visit: Payer: Self-pay | Admitting: Internal Medicine

## 2023-09-30 DIAGNOSIS — E785 Hyperlipidemia, unspecified: Secondary | ICD-10-CM

## 2023-09-30 DIAGNOSIS — I2510 Atherosclerotic heart disease of native coronary artery without angina pectoris: Secondary | ICD-10-CM

## 2023-11-14 ENCOUNTER — Other Ambulatory Visit: Payer: Self-pay | Admitting: Internal Medicine

## 2024-04-08 ENCOUNTER — Other Ambulatory Visit: Payer: Self-pay | Admitting: *Deleted

## 2024-04-08 DIAGNOSIS — E785 Hyperlipidemia, unspecified: Secondary | ICD-10-CM

## 2024-05-02 ENCOUNTER — Encounter: Payer: Self-pay | Admitting: Internal Medicine

## 2024-05-06 ENCOUNTER — Other Ambulatory Visit: Payer: Self-pay | Admitting: Internal Medicine

## 2024-05-16 ENCOUNTER — Other Ambulatory Visit: Payer: Self-pay | Admitting: Internal Medicine

## 2024-05-16 DIAGNOSIS — E785 Hyperlipidemia, unspecified: Secondary | ICD-10-CM

## 2024-05-16 DIAGNOSIS — I2510 Atherosclerotic heart disease of native coronary artery without angina pectoris: Secondary | ICD-10-CM

## 2024-05-17 LAB — LIPID PANEL
Chol/HDL Ratio: 1.9 ratio (ref 0.0–4.4)
Cholesterol, Total: 115 mg/dL (ref 100–199)
HDL: 62 mg/dL (ref >39)
LDL Chol Calc (NIH): 39 mg/dL (ref 0–99)
Triglycerides: 69 mg/dL (ref 0–149)
VLDL Cholesterol Cal: 14 mg/dL (ref 5–40)

## 2024-05-23 ENCOUNTER — Ambulatory Visit: Payer: Self-pay | Admitting: Internal Medicine

## 2024-05-24 NOTE — Progress Notes (Unsigned)
 Cardiology Office Note   Date:  06/01/2024  ID:  Sherry, Ford February 02, 1956, MRN [id number] PCP: Windy Coy, MD  [facility] Providers Cardiologist:  None      Referred to Advanced Lipid Disorders clinic and seen by Dr. Mona on 12/27/2018 4 longstanding history of mixed hyperlipidemia and intolerance to statins causing myalgias, fatigue, and muscle weakness.  Symptoms resolved after discontinuing the statins and returned when she restarted the medications.  Recent NMR showed LDL particle number of 2517 with an LDL-C of 151.  She had tried atorvastatin, rosuvastatin, Livalo, and ezetimibe .  She was not on medication at the time of referral.  Family history of elevated cholesterol, although her father died at age 37 of colon cancer, her brother died at age 28 with non-Hodgkin's lymphoma secondary to HIV disease, there is no significant early onset of heart disease that had been identified.  CT calcium score 12/2018 showed CAC score of 21 (64th percentile).  PCSK9 inhibitor therapy was recommended, but denied by insurance.  We were able to get approval for Nexletol  180 mg daily.  She took the medication for several weeks to a month and noted initially some dizziness but that somewhat improved but then ultimately developed nausea.  She discontinued Nexletol  and symptoms improved.  She was asked to try Nexletol  and ezetimibe  and ultimately discontinued Nexletol .  She had some improvement on ezetimibe  alone.  She was started on PCSK9i therapy, having been on both Praluent  and Repatha , but Repatha  caused severe back pain and had significant improvement in lipids.   Last clinic visit was 05/25/2023 with Dr. Mona at which time she was doing well on Praluent  but was paying about $300 a month for the medication.  Lipids were well-controlled with total cholesterol 117 triglycerides 68, HDL 60, and LDL 43.  Lipid panel 05/16/2024 with total cholesterol 115, triglycerides 69, HDL 62, LDL-C  39.  History of Present Illness Discussed the use of AI scribe software for clinical note transcription with the patient, who gave verbal consent to proceed.  History of Present Illness Sherry Ford is a very pleasant 68 year old female who presents for follow-up of hyperlipidemia.  She manages hyperlipidemia with Praluent  and ezetimibe  and is tolerating medications without concerning side effects. LDL is well controlled at 39 mg/dL. Praluent  is effective but she had a high out-of-pocket cost at times during the previous year, but she prefers this medication over Repatha  based on side effects.  Her insurance provider prefers Repatha . She has intermittent low blood pressure with readings to 95/55 mmHg, causing dizziness and feeling a little weird.  She ate some salted nuts and felt better.  Reports previous sodium level on lab work was low. Her husband's prolonged illness and death in 11/12/23 reduced her activity. She currently walks 25-30 minutes most days and wants to increase her exercise. She does not drink alcohol and generally eats a healthy diet.  She denies chest pain, shortness of breath, orthopnea, PND, edema, palpitations, presyncope, or syncope.   ROS: See HPI  Studies Reviewed EKG Interpretation Date/Time:  Wednesday June 01 2024 14:41:57 EST Ventricular Rate:  83 PR Interval:  128 QRS Duration:  64 QT Interval:  384 QTC Calculation: 451 R Axis:   55  Text Interpretation: Normal sinus rhythm Normal ECG No previous ECGs available Confirmed by Percy Browning [phone]) on 06/01/2024 2:55:53 PM     No results found for: LIPOA  Risk Assessment/Calculations  Physical Exam VS:  BP 114/66   Pulse 86   Ht 5' 2 (1.575 m)   Wt 117 lb 6.4 oz (53.3 kg)   SpO2 96%   BMI 21.47 kg/m    Wt Readings from Last 3 Encounters:  06/01/24 117 lb 6.4 oz (53.3 kg)  05/25/23 116 lb (52.6 kg)  04/17/21 119 lb 9.6 oz (54.3 kg)    GEN: Well nourished, well  developed in no acute distress NECK: No JVD; No carotid bruits CARDIAC: RRR, no murmurs, rubs, gallops RESPIRATORY:  Clear to auscultation without rales, wheezing or rhonchi  ABDOMEN: Soft, non-tender, non-distended EXTREMITIES:  No edema; No deformity   Assessment & Plan Hyperlipidemia LDL goal < 70 Lipid panel completed 05/16/2024 with total cholesterol 115, triglycerides 69, HDL 62, and LDL-C 39. Her hyperlipidemia is well-controlled with Praluent  and ezetimibe , with an LDL of 39.  She is not having any concerning side effects.  She prefers Praluent  over Repatha  despite insurance preference. Discussed cost issues and assistance options.  - Continue Praluent  75 mg every 14 days and ezetimibe  10 mg daily - Monitor for cost issues with Praluent  and contact the office if assistance is needed - Heart healthy diet avoiding processed foods, saturated fat, sugar, and other simple carbohydrates - Aim for at least 150 minutes of moderate intensity exercise each week  Coronary artery disease    CT calcium score of 21 in 2020.  She asks about repeating test.  Discussed the potential for increased calcification and consideration of an advanced coronary CT with contrast for detailed plaque assessment.  She is currently walking for exercise but admits she has not been as active over the last year due to her husband's prolonged illness and death. She denies chest pain, shortness of breath, palpitations, or other symptoms concerning for angina. Provided instructions for the coronary CT and encouraged follow-up if she would like to proceed with testing.  Could also consider repeat CT calcium score. - Consider further ischemia evaluation and mapping of coronary anatomy - Continue Praluent , ezetimibe  - Heart healthy lifestyle encouraged  Orthostatic hypotension Recent episode of lower than normal SBP around 95 mmHg which caused her to feel off.  No presyncope or syncope.  She is not on antihypertensive  therapy.  Reports history of low/normal sodium. -Recommend eating a salty healthy snack during episodes of hypotension        Dispo: 1 year with Dr. Mona or me  Signed, Rosaline Bane, NP-C

## 2024-06-01 ENCOUNTER — Encounter (HOSPITAL_BASED_OUTPATIENT_CLINIC_OR_DEPARTMENT_OTHER): Payer: Self-pay | Admitting: Nurse Practitioner

## 2024-06-01 ENCOUNTER — Ambulatory Visit (INDEPENDENT_AMBULATORY_CARE_PROVIDER_SITE_OTHER): Admitting: Nurse Practitioner

## 2024-06-01 VITALS — BP 114/66 | HR 86 | Ht 62.0 in | Wt 117.4 lb

## 2024-06-01 DIAGNOSIS — E785 Hyperlipidemia, unspecified: Secondary | ICD-10-CM

## 2024-06-01 DIAGNOSIS — I2510 Atherosclerotic heart disease of native coronary artery without angina pectoris: Secondary | ICD-10-CM | POA: Diagnosis not present

## 2024-06-01 DIAGNOSIS — I951 Orthostatic hypotension: Secondary | ICD-10-CM | POA: Diagnosis not present

## 2024-06-01 MED ORDER — EZETIMIBE 10 MG PO TABS: 10.0000 mg | ORAL_TABLET | Freq: Every day | ORAL | 3 refills | Status: AC

## 2024-06-01 NOTE — Patient Instructions (Addendum)
 Medication Instructions:  Your physician recommends that you continue on your current medications as directed. Please refer to the Current Medication list given to you today.   *If you need a refill on your cardiac medications before your next appointment, please call your pharmacy*  Lab Work: NONE.  Testing/Procedures: REVIEW INFORMATION ON CARDIAC CT AND LET US  KNOW IF YOU DECIDE TO PROCEED   Follow-Up: At [facility], you and your health needs are our priority.  As part of our continuing mission to provide you with exceptional heart care, our providers are all part of one team.  This team includes your primary Cardiologist (physician) and Advanced Practice Providers or APPs (Physician Assistants and Nurse Practitioners) who all work together to provide you with the care you need, when you need it.  Your next appointment:   12 month(s)  Provider:   K. Chad Hilty, MD or Rosaline Bane, NP    We recommend signing up for the patient portal called MyChart.  Sign up information is provided on this After Visit Summary.  MyChart is used to connect with patients for Virtual Visits (Telemedicine).  Patients are able to view lab/test results, encounter notes, upcoming appointments, etc.  Non-urgent messages can be sent to your provider as well.   To learn more about what you can do with MyChart, go to [email or web address].   Other Instructions   Your cardiac CT will be scheduled at one of the below locations:   [facility] [address] [place], [place] [address] [phone] (Severe contrast allergies only)  OR   [facility] [address] [place], [place] [address] [phone]  OR   [facility] [address] [place], [place] [address] [phone]  OR   [facility] and Vascular Tower [address]  [place], [place] [address]  OR   [facility] [address] [place],  [place] [phone]  If scheduled at [facility], please arrive at the [facility] and Children's Entrance (Entrance C2) of [facility] 30 minutes prior to test start time. You can use the FREE valet parking offered at entrance C (encouraged to control the heart rate for the test)  Proceed to the [facility] Radiology Department (first floor) to check-in and test prep.  All radiology patients and guests should use entrance C2 at [facility], accessed from [facility], even though the hospital's physical address listed is [address].  If scheduled at the Heart and Vascular Tower at [address], please enter the parking lot using the [address] entrance and use the FREE valet service at the patient drop-off area. Enter the building and check-in with registration on the main floor.  If scheduled at [facility], please arrive to the Heart and Vascular Center 15 mins early for check-in and test prep.  There is spacious parking and easy access to the radiology department from the [facility] Heart and Vascular entrance. Please enter here and check-in with the desk attendant.   If scheduled at [facility], please arrive 30 minutes early for check-in and test prep.  Please follow these instructions carefully (unless otherwise directed):  An IV will be required for this test and Nitroglycerin will be given.  Hold all erectile dysfunction medications at least 3 days (72 hrs) prior to test. (Ie viagra, cialis, sildenafil, tadalafil, etc)   On the Night Before the Test: Be sure to Drink plenty of  water. Do not consume any caffeinated/decaffeinated beverages or chocolate 12 hours prior to your test. Do not take any antihistamines 12 hours prior to your test.  On the Day of the Test: Drink plenty of water until 1 hour prior to the test. Do not eat any food 1 hour prior to test. You may take your regular medications prior to the  test.  Take metoprolol (Lopressor) two hours prior to test. If you take Furosemide/Hydrochlorothiazide/Spironolactone/Chlorthalidone, please HOLD on the morning of the test. Patients who wear a continuous glucose monitor MUST remove the device prior to scanning. FEMALES- please wear underwire-free bra if available, avoid dresses & tight clothing  After the Test: Drink plenty of water. After receiving IV contrast, you may experience a mild flushed feeling. This is normal. On occasion, you may experience a mild rash up to 24 hours after the test. This is not dangerous. If this occurs, you can take Benadryl 25 mg, Zyrtec, Claritin, or Allegra and increase your fluid intake. (Patients taking Tikosyn should avoid Benadryl, and may take Zyrtec, Claritin, or Allegra) If you experience trouble breathing, this can be serious. If it is severe call 911 IMMEDIATELY. If it is mild, please call our office.  We will call to schedule your test 2-4 weeks out understanding that some insurance companies will need an authorization prior to the service being performed.   For more information and frequently asked questions, please visit our website : [email or web address]  For non-scheduling related questions, please contact the cardiac imaging nurse navigator should you have any questions/concerns: Cardiac Imaging Nurse Navigators Direct Office Dial: [phone]   For scheduling needs, including cancellations and rescheduling, please call Brittany, [phone].   Cardiac CT Angiogram A cardiac CT angiogram is a procedure to look at the heart and the area around the heart. It may be done to help find the cause of chest pains or other symptoms of heart disease. During this procedure, a substance called contrast dye is injected into a vein in the arm. The contrast highlights the blood vessels in the area to be checked. A large X-ray machine (CT scanner), then takes detailed pictures of the heart and the  surrounding area. The procedure is also sometimes called a coronary CT angiogram, coronary artery scanning, or CTA. A cardiac CT angiogram allows the health care provider to see how well blood is flowing to and from the heart. The provider will be able to see if there are any problems, such as: Blockage or narrowing of the arteries in the heart. Fluid around the heart. Signs of weakness or disease in the muscles, valves, and tissues of the heart. Tell a health care provider about: Any allergies you have. This is especially important if you have had a previous allergic reaction to medicines, contrast dye, or iodine. All medicines you are taking, including vitamins, herbs, eye drops, creams, and over-the-counter medicines. Any bleeding problems you have. Any surgeries you have had. Any medical conditions you have, including kidney problems or kidney failure. Whether you are pregnant or may be pregnant. Any anxiety disorders, chronic pain, or other conditions you have. These may increase your stress or prevent you from lying still. Any history of abnormal heart rhythms or heart procedures. What are the risks? Your provider will talk with you about risks. These may include: Bleeding. Infection. Allergic reactions to medicines or dyes. Damage to other structures or organs. Kidney damage from the contrast dye. Increased risk of cancer from radiation exposure. This risk is low. Talk with  your provider about: The risks and benefits of testing. How you can receive the lowest dose of radiation. What happens before the procedure? Wear comfortable clothing and remove any jewelry, glasses, dentures, and hearing aids. Follow instructions from your provider about eating and drinking. These may include: 12 hours before the procedure Avoid caffeine. This includes tea, coffee, soda, energy drinks, and diet pills. Drink plenty of water or other fluids that do not have caffeine in them. Being well hydrated  can prevent complications. 4-6 hours before the procedure Stop eating and drinking. This will reduce the risk of nausea from the contrast dye. Ask your provider about changing or stopping your regular medicines. These include: Diabetes medicines. Medicines to treat problems with erections (erectile dysfunction). If you have kidney problems, you may need to receive IV hydration before and after the test. What happens during the procedure?  Hair on your chest may need to be removed so that small sticky patches called electrodes can be placed on your chest. These will transmit information that helps to monitor your heart during the procedure. An IV will be inserted into one of your veins. You might be given a medicine to control your heart rate during the procedure. This will help to ensure that good images are obtained. You will be asked to lie on an exam table. This table will slide in and out of the CT machine during the procedure. Contrast dye will be injected into the IV. You might feel warm, or you may get a metallic taste in your mouth. You may be given medicines to relax or dilate the arteries in your heart. If you are allergic to contrast dyes or iodine you may be given medicine before the test to reduce the risk of an allergic reaction. The table that you are lying on will move into the CT machine tunnel for the scan. The person running the machine will give you instructions while the scans are being done. You may be asked to: Keep your arms above your head. Hold your breath for short periods. Stay very still, even if the table is moving. The procedure may vary among providers and hospitals. What can I expect after the procedure? After your procedure, it is common to have: A metallic taste in your mouth from the contrast dye. A feeling of warmth. A headache from the heart medicine. Follow these instructions at home: Take over-the-counter and prescription medicines only as told by  your provider. If you are told, drink enough fluid to keep your pee pale yellow. This will help to flush the contrast dye out of your body. Most people can return to their normal activities right after the procedure. Ask your provider what activities are safe for you. It is up to you to get the results of your procedure. Ask your provider, or the department that is doing the procedure, when your results will be ready. Contact a health care provider if: You have any symptoms of allergy to the contrast dye. These include: Shortness of breath. Rash or hives. A racing heartbeat. You notice a change in your peeing (urination). This information is not intended to replace advice given to you by your health care provider. Make sure you discuss any questions you have with your health care provider. Document Revised: 01/03/2022 Document Reviewed: 01/03/2022 Elsevier Patient Education  2024 [employer].
